# Patient Record
Sex: Male | Born: 1955
Health system: Southern US, Community
[De-identification: ages and names within clinical notes are randomized; demographics above are authoritative.]

## PROBLEM LIST (undated history)

## (undated) DIAGNOSIS — Z85828 Personal history of other malignant neoplasm of skin: Secondary | ICD-10-CM

## (undated) DIAGNOSIS — I10 Essential (primary) hypertension: Secondary | ICD-10-CM

## (undated) DIAGNOSIS — G4730 Sleep apnea, unspecified: Secondary | ICD-10-CM

## (undated) DIAGNOSIS — K219 Gastro-esophageal reflux disease without esophagitis: Secondary | ICD-10-CM

## (undated) DIAGNOSIS — J449 Chronic obstructive pulmonary disease, unspecified: Secondary | ICD-10-CM

## (undated) HISTORY — DX: Gastro-esophageal reflux disease without esophagitis: K21.9

## (undated) HISTORY — DX: Essential (primary) hypertension: I10

## (undated) HISTORY — DX: Sleep apnea, unspecified: G47.30

## (undated) HISTORY — PX: EYE SURGERY: SHX253

## (undated) HISTORY — PX: HERNIA REPAIR: SHX51

## (undated) HISTORY — DX: Personal history of other malignant neoplasm of skin: Z85.828

## (undated) HISTORY — PX: COSMETIC SURGERY: SHX468

## (undated) HISTORY — DX: Chronic obstructive pulmonary disease, unspecified: J44.9

---

## 2007-02-26 ENCOUNTER — Ambulatory Visit: Payer: Self-pay | Admitting: Surgery

## 2007-03-07 ENCOUNTER — Ambulatory Visit: Payer: Self-pay | Admitting: Surgery

## 2009-10-17 ENCOUNTER — Ambulatory Visit: Payer: Self-pay | Admitting: Gastroenterology

## 2009-11-01 ENCOUNTER — Ambulatory Visit: Payer: Self-pay | Admitting: Family Medicine

## 2013-11-24 ENCOUNTER — Ambulatory Visit: Payer: Self-pay | Admitting: Family Medicine

## 2014-11-29 ENCOUNTER — Telehealth: Payer: Self-pay | Admitting: Family Medicine

## 2014-11-29 DIAGNOSIS — N528 Other male erectile dysfunction: Secondary | ICD-10-CM

## 2014-11-29 DIAGNOSIS — K219 Gastro-esophageal reflux disease without esophagitis: Secondary | ICD-10-CM

## 2014-11-29 MED ORDER — OMEPRAZOLE 20 MG PO CPDR: 20.0000 mg | DELAYED_RELEASE_CAPSULE | Freq: Every day | ORAL | Status: DC

## 2014-11-29 MED ORDER — SILDENAFIL CITRATE 100 MG PO TABS: 100.0000 mg | ORAL_TABLET | Freq: Every day | ORAL | Status: DC | PRN

## 2014-11-29 NOTE — Telephone Encounter (Signed)
Pt is requesting a prescription for Omeprazole and Viagra. [facility] [phone](W) [phone] (C)

## 2014-12-07 ENCOUNTER — Other Ambulatory Visit: Payer: Self-pay | Admitting: Family Medicine

## 2015-01-11 ENCOUNTER — Ambulatory Visit (INDEPENDENT_AMBULATORY_CARE_PROVIDER_SITE_OTHER): Payer: 59 | Admitting: Family Medicine

## 2015-01-11 ENCOUNTER — Encounter: Payer: Self-pay | Admitting: Family Medicine

## 2015-01-11 ENCOUNTER — Ambulatory Visit
Admission: RE | Admit: 2015-01-11 | Discharge: 2015-01-11 | Disposition: A | Discharge status: Home / Self Care | Payer: 59 | Source: Ambulatory Visit | Attending: Family Medicine | Admitting: Family Medicine

## 2015-01-11 VITALS — BP 120/70 | HR 67 | Temp 98.1°F | Resp 18 | Ht 73.0 in | Wt 157.0 lb

## 2015-01-11 DIAGNOSIS — I10 Essential (primary) hypertension: Secondary | ICD-10-CM

## 2015-01-11 DIAGNOSIS — S92044D Nondisplaced other fracture of tuberosity of right calcaneus, subsequent encounter for fracture with routine healing: Secondary | ICD-10-CM | POA: Insufficient documentation

## 2015-01-11 DIAGNOSIS — J431 Panlobular emphysema: Secondary | ICD-10-CM

## 2015-01-11 DIAGNOSIS — X58XXXD Exposure to other specified factors, subsequent encounter: Secondary | ICD-10-CM | POA: Insufficient documentation

## 2015-01-11 DIAGNOSIS — M79671 Pain in right foot: Secondary | ICD-10-CM | POA: Diagnosis present

## 2015-01-11 DIAGNOSIS — R739 Hyperglycemia, unspecified: Secondary | ICD-10-CM

## 2015-01-11 DIAGNOSIS — N529 Male erectile dysfunction, unspecified: Secondary | ICD-10-CM | POA: Diagnosis not present

## 2015-01-11 DIAGNOSIS — J449 Chronic obstructive pulmonary disease, unspecified: Secondary | ICD-10-CM | POA: Insufficient documentation

## 2015-01-11 LAB — POCT GLYCOSYLATED HEMOGLOBIN (HGB A1C): HEMOGLOBIN A1C: 5.2

## 2015-01-11 LAB — GLUCOSE, POCT (MANUAL RESULT ENTRY): POC GLUCOSE: 107 mg/dL — AB (ref 70–99)

## 2015-01-11 NOTE — Progress Notes (Signed)
Name: Aaron Ross   MRN: [id number]    DOB: 1955/12/19   Date:01/11/2015       Progress Note  Subjective  Chief Complaint  Chief Complaint  Patient presents with  . Hypertension  . Foot Pain     hurt right foot 3 weks ago, swollen, painful    Hypertension This is a chronic problem. The current episode started more than 1 year ago. The problem is unchanged. The problem is controlled. Associated symptoms include anxiety and shortness of breath. Pertinent negatives include no blurred vision, chest pain, headaches, neck pain, orthopnea or palpitations. There are no associated agents to hypertension. Past treatments include angiotensin blockers. The current treatment provides moderate improvement.  Foot Pain This is a new (onset past jumping out of a pool) problem. The current episode started 1 to 4 weeks ago. The problem occurs constantly. The problem has been unchanged. Associated symptoms include coughing. Pertinent negatives include no chest pain, chills, congestion, fever, headaches, myalgias, nausea, neck pain, sore throat, vomiting or weakness. The symptoms are aggravated by walking. The treatment provided mild relief.  Breathing Problem He complains of cough, difficulty breathing and shortness of breath. There is no hemoptysis. This is a chronic problem. The current episode started more than 1 year ago. The problem occurs intermittently. The problem has been unchanged. The cough is non-productive. Pertinent negatives include no chest pain, fever, headaches, heartburn, myalgias, sore throat or weight loss. His symptoms are aggravated by exposure to smoke and strenuous activity. His symptoms are alleviated by ipratropium. He reports moderate improvement on treatment. His past medical history is significant for emphysema.   Hyperglycemia  H/o elevated glucose to 103.  No polyuria, polydipsia, polyhpahgia.  No significant wt loss.    Past Medical History  Diagnosis Date  . Hypertension   .  GERD (gastroesophageal reflux disease)     History  Substance Use Topics  . Smoking status: Former Smoker    Quit date: 07/13/2014  . Smokeless tobacco: Never Used  . Alcohol Use: 0.6 oz/week    1 Standard drinks or equivalent per week     Comment: glass of wine a day     Current outpatient prescriptions:  .  losartan (COZAAR) 50 MG tablet, Take 50 mg by mouth daily., Disp: , Rfl: 5 .  omeprazole (PRILOSEC) 20 MG capsule, Take 1 capsule (20 mg total) by mouth daily., Disp: 30 capsule, Rfl: 3 .  sildenafil (VIAGRA) 100 MG tablet, Take 1 tablet (100 mg total) by mouth daily as needed for erectile dysfunction., Disp: 5 tablet, Rfl: 11  No Known Allergies  Review of Systems  Constitutional: Negative for fever, chills and weight loss.  HENT: Negative for congestion, hearing loss, sore throat and tinnitus.   Eyes: Negative for blurred vision, double vision and redness.  Respiratory: Positive for cough and shortness of breath. Negative for hemoptysis.   Cardiovascular: Negative for chest pain, palpitations, orthopnea, claudication and leg swelling.  Gastrointestinal: Negative for heartburn, nausea, vomiting, diarrhea, constipation and blood in stool.  Genitourinary: Negative for dysuria, urgency, frequency and hematuria.  Musculoskeletal: Negative for myalgias, back pain, joint pain, falls and neck pain.       Right foot pain   Skin: Negative for itching.  Neurological: Negative for dizziness, tingling, tremors, focal weakness, seizures, loss of consciousness, weakness and headaches.  Endo/Heme/Allergies: Does not bruise/bleed easily.  Psychiatric/Behavioral: Negative for depression and substance abuse. The patient is not nervous/anxious and does not have insomnia.  Objective  Filed Vitals:   01/11/15 0737  BP: 120/70  Pulse: 67  Temp: 98.1 F (36.7 C)  TempSrc: Oral  Resp: 18  Height: 6\' 1"  (1.854 m)  Weight: 157 lb (71.215 kg)     Physical Exam  Constitutional: He  is oriented to person, place, and time and well-developed, well-nourished, and in no distress.  HENT:  Head: Normocephalic.  Eyes: EOM are normal. Pupils are equal, round, and reactive to light.  Neck: Normal range of motion. Neck supple. No thyromegaly present.  Cardiovascular: Normal rate, regular rhythm and normal heart sounds.   No murmur heard. Pulmonary/Chest: Effort normal and breath sounds normal. No respiratory distress. He has no wheezes.  Abdominal: Soft. Bowel sounds are normal.  Musculoskeletal: He exhibits no edema.  Tender on calcaneous both inferiorly and posteriorly   Lymphadenopathy:    He has no cervical adenopathy.  Neurological: [name] and oriented to person, place, and time. No cranial nerve deficit. Gait normal. Coordination normal.  Skin: Skin is warm and dry. No rash noted.  Psychiatric: Affect and judgment normal.      Assessment & Plan  ,1. Essential hypertension controlled - Comprehensive metabolic panel  2. Panlobular emphysema Refused to use Symbicort  3. Hyperglycemia R/o diabetes - POCT HgB A1C - POCT Glucose (CBG) - Comprehensive metabolic panel  4. Erectile dysfunction, unspecified erectile dysfunction type viagra  5. Foot pain, right Xray to r/o fx - DG Foot Complete Right; Future

## 2015-01-11 NOTE — Patient Instructions (Signed)
Xray right foot.

## 2015-01-13 ENCOUNTER — Telehealth: Payer: Self-pay | Admitting: Emergency Medicine

## 2015-01-13 DIAGNOSIS — S92001A Unspecified fracture of right calcaneus, initial encounter for closed fracture: Secondary | ICD-10-CM

## 2015-01-13 NOTE — Telephone Encounter (Signed)
Patient notified of lab results and appointment has been made for [facility]

## 2015-03-18 ENCOUNTER — Other Ambulatory Visit: Payer: Self-pay | Admitting: Family Medicine

## 2015-03-21 NOTE — Telephone Encounter (Signed)
I am forwarding this encounter to the designated PCP and/or their nursing staff for further management of the tasks requested. Thank you.  

## 2015-08-15 ENCOUNTER — Other Ambulatory Visit: Payer: Self-pay | Admitting: Family Medicine

## 2015-10-19 ENCOUNTER — Other Ambulatory Visit: Payer: Self-pay | Admitting: Family Medicine

## 2015-10-25 ENCOUNTER — Encounter: Payer: Self-pay | Admitting: Family Medicine

## 2015-10-25 ENCOUNTER — Ambulatory Visit (INDEPENDENT_AMBULATORY_CARE_PROVIDER_SITE_OTHER): Payer: 59 | Admitting: Family Medicine

## 2015-10-25 VITALS — BP 160/88 | HR 58 | Temp 97.9°F | Resp 18 | Ht 73.0 in | Wt 160.8 lb

## 2015-10-25 DIAGNOSIS — I10 Essential (primary) hypertension: Secondary | ICD-10-CM | POA: Diagnosis not present

## 2015-10-25 MED ORDER — LOSARTAN POTASSIUM 100 MG PO TABS: 100.0000 mg | ORAL_TABLET | Freq: Every day | ORAL | Status: DC

## 2015-10-25 NOTE — Progress Notes (Signed)
Name: Aaron Ross   MRN: [id number]    DOB: June 26, 1955   Date:10/25/2015       Progress Note  Subjective  Chief Complaint  Chief Complaint  Patient presents with  . Hypertension    BP has been elevated in the 160's    Hypertension This is a chronic problem. The problem is unchanged. The problem is uncontrolled. Pertinent negatives include no blurred vision, chest pain, headaches, palpitations or shortness of breath. Past treatments include angiotensin blockers. There are no compliance problems.  There is no history of kidney disease, CAD/MI or CVA.    Past Medical History  Diagnosis Date  . Hypertension   . GERD (gastroesophageal reflux disease)     Past Surgical History  Procedure Laterality Date  . Hernia repair      Family History  Problem Relation Age of Onset  . Hypertension Father     Social History   Social History  . Marital Status: Married    Spouse Name: N/A  . Number of Children: N/A  . Years of Education: N/A   Occupational History  . Not on file.   Social History Main Topics  . Smoking status: Former Smoker    Quit date: 07/13/2014  . Smokeless tobacco: Never Used  . Alcohol Use: 0.6 oz/week    1 Standard drinks or equivalent per week     Comment: glass of wine a day  . Drug Use: No  . Sexual Activity: Yes   Other Topics Concern  . Not on file   Social History Narrative     Current outpatient prescriptions:  .  aspirin EC 81 MG tablet, Take 81 mg by mouth daily., Disp: , Rfl:  .  losartan (COZAAR) 50 MG tablet, TAKE 1 TABLET BY MOUTH DAILY, Disp: 30 tablet, Rfl: 1 .  omeprazole (PRILOSEC) 20 MG capsule, TAKE ONE CAPSULE BY MOUTH DAILY, Disp: 30 capsule, Rfl: 3 .  sildenafil (VIAGRA) 100 MG tablet, Take 1 tablet (100 mg total) by mouth daily as needed for erectile dysfunction., Disp: 5 tablet, Rfl: 11  No Known Allergies   Review of Systems  Eyes: Negative for blurred vision.  Respiratory: Negative for shortness of breath.    Cardiovascular: Negative for chest pain and palpitations.  Neurological: Negative for headaches.   Objective  Filed Vitals:   10/25/15 1057  BP: 160/88  Pulse: 58  Temp: 97.9 F (36.6 C)  TempSrc: Oral  Resp: 18  Height:  (1.854 m)  Weight: 160 lb 12.8 oz (72.938 kg)  SpO2: 96%    Physical Exam  Constitutional: He is oriented to person, place, and time and well-developed, well-nourished, and in no distress.  HENT:  Head: Normocephalic and atraumatic.  Cardiovascular: Normal rate and regular rhythm.   Pulmonary/Chest: Effort normal and breath sounds normal.  Musculoskeletal:       Right ankle: He exhibits no swelling.       Left ankle: He exhibits no swelling.  Neurological: [name] and oriented to person, place, and time.  Nursing note and vitals reviewed.     Assessment & Plan  1. Essential hypertension Elevated blood pressure on manual repeat, will increase losartan from 50 mg to 100 mg daily. Advised to keep blood pressure logs and follow-up in one month. - Comprehensive Metabolic Panel (CMET) - Lipid Profile - losartan (COZAAR) 100 MG tablet; Take 1 tablet (100 mg total) by mouth daily.  Dispense: 30 tablet; Refill: 0    Maryana Pittmon Asad A. Sherryll Burger  [facility] 10/25/2015 11:06 AM

## 2015-10-26 LAB — COMPREHENSIVE METABOLIC PANEL
A/G RATIO: 1.9 (ref 1.2–2.2)
ALT: 25 IU/L (ref 0–44)
AST: 27 IU/L (ref 0–40)
Albumin: 4.7 g/dL (ref 3.5–5.5)
Alkaline Phosphatase: 87 IU/L (ref 39–117)
BUN/Creatinine Ratio: 15 (ref 9–20)
BUN: 14 mg/dL (ref 6–24)
Bilirubin Total: 0.5 mg/dL (ref 0.0–1.2)
CALCIUM: 9.7 mg/dL (ref 8.7–10.2)
CO2: 26 mmol/L (ref 18–29)
CREATININE: 0.94 mg/dL (ref 0.76–1.27)
Chloride: 99 mmol/L (ref 96–106)
GFR calc Af Amer: 103 mL/min/{1.73_m2} (ref >59)
GFR, EST NON AFRICAN AMERICAN: 89 mL/min/{1.73_m2} (ref >59)
Globulin, Total: 2.5 g/dL (ref 1.5–4.5)
Glucose: 100 mg/dL — ABNORMAL HIGH (ref 65–99)
POTASSIUM: 5.6 mmol/L — AB (ref 3.5–5.2)
Sodium: 142 mmol/L (ref 134–144)
Total Protein: 7.2 g/dL (ref 6.0–8.5)

## 2015-10-26 LAB — LIPID PANEL
CHOL/HDL RATIO: 2.8 ratio (ref 0.0–5.0)
Cholesterol, Total: 205 mg/dL — ABNORMAL HIGH (ref 100–199)
HDL: 74 mg/dL (ref >39)
LDL Calculated: 113 mg/dL — ABNORMAL HIGH (ref 0–99)
TRIGLYCERIDES: 92 mg/dL (ref 0–149)
VLDL Cholesterol Cal: 18 mg/dL (ref 5–40)

## 2015-11-29 ENCOUNTER — Ambulatory Visit (INDEPENDENT_AMBULATORY_CARE_PROVIDER_SITE_OTHER): Payer: BLUE CROSS/BLUE SHIELD | Admitting: Family Medicine

## 2015-11-29 ENCOUNTER — Encounter: Payer: Self-pay | Admitting: Family Medicine

## 2015-11-29 VITALS — BP 141/78 | HR 60 | Temp 98.1°F | Resp 15 | Ht 73.0 in | Wt 164.2 lb

## 2015-11-29 DIAGNOSIS — E785 Hyperlipidemia, unspecified: Secondary | ICD-10-CM | POA: Diagnosis not present

## 2015-11-29 DIAGNOSIS — K219 Gastro-esophageal reflux disease without esophagitis: Secondary | ICD-10-CM | POA: Diagnosis not present

## 2015-11-29 DIAGNOSIS — I10 Essential (primary) hypertension: Secondary | ICD-10-CM

## 2015-11-29 MED ORDER — LOSARTAN POTASSIUM 100 MG PO TABS: 100.0000 mg | ORAL_TABLET | Freq: Every day | ORAL | Status: DC

## 2015-11-29 MED ORDER — OMEPRAZOLE 20 MG PO CPDR
20.0000 mg | DELAYED_RELEASE_CAPSULE | Freq: Every day | ORAL | Status: DC
Start: 2015-11-29 — End: 2016-05-24

## 2015-11-29 MED ORDER — ROSUVASTATIN CALCIUM 5 MG PO TABS: 5.0000 mg | ORAL_TABLET | Freq: Every day | ORAL | Status: DC

## 2015-11-29 NOTE — Progress Notes (Signed)
Name: Aaron ForthZoltan Gellerman   MRN: [id number]    DOB: 10/09/1955   Date:11/29/2015       Progress Note  Subjective  Chief Complaint  Chief Complaint  Patient presents with  . Follow-up    1 mo  . Medication Refill    losartan 100 mg / omeprazole 20 mg     Hypertension This is a chronic problem. The problem is unchanged. The problem is uncontrolled. Pertinent negatives include no blurred vision, chest pain, headaches, palpitations or shortness of breath. Past treatments include angiotensin blockers. There are no compliance problems.  There is no history of kidney disease, CAD/MI or CVA.  Gastroesophageal Reflux He reports no abdominal pain, no chest pain, no coughing, no heartburn or no nausea. This is a chronic problem. The problem has been unchanged. The symptoms are aggravated by certain foods (tomatoes, pepperoni, V8 juice.). He has tried a PPI for the symptoms.  Hyperlipidemia This is a new problem. This is a new diagnosis. The problem is uncontrolled. Pertinent negatives include no chest pain or shortness of breath. He is currently on no antihyperlipidemic treatment.    Past Medical History  Diagnosis Date  . Hypertension   . GERD (gastroesophageal reflux disease)     Past Surgical History  Procedure Laterality Date  . Hernia repair      Family History  Problem Relation Age of Onset  . Hypertension Father     Social History   Social History  . Marital Status: Married    Spouse Name: N/A  . Number of Children: N/A  . Years of Education: N/A   Occupational History  . Not on file.   Social History Main Topics  . Smoking status: Former Smoker    Quit date: 07/13/2014  . Smokeless tobacco: Never Used  . Alcohol Use: 0.6 oz/week    1 Standard drinks or equivalent per week     Comment: glass of wine a day  . Drug Use: No  . Sexual Activity: Yes   Other Topics Concern  . Not on file   Social History Narrative     Current outpatient prescriptions:  .  aspirin EC 81  MG tablet, Take 81 mg by mouth daily., Disp: , Rfl:  .  finasteride (PROSCAR) 5 MG tablet, Take 5 mg by mouth daily., Disp: , Rfl: 5 .  losartan (COZAAR) 100 MG tablet, Take 1 tablet (100 mg total) by mouth daily., Disp: 30 tablet, Rfl: 0 .  omeprazole (PRILOSEC) 20 MG capsule, TAKE ONE CAPSULE BY MOUTH DAILY, Disp: 30 capsule, Rfl: 3 .  sildenafil (VIAGRA) 100 MG tablet, Take 1 tablet (100 mg total) by mouth daily as needed for erectile dysfunction., Disp: 5 tablet, Rfl: 11  No Known Allergies   Review of Systems  Eyes: Negative for blurred vision.  Respiratory: Negative for cough and shortness of breath.   Cardiovascular: Negative for chest pain and palpitations.  Gastrointestinal: Negative for heartburn, nausea, vomiting and abdominal pain.  Neurological: Negative for headaches.    Objective  Filed Vitals:   11/29/15 0843  BP: 141/78  Pulse: 60  Temp: 98.1 F (36.7 C)  TempSrc: Oral  Resp: 15  Height: 6\' 1"  (1.854 m)  Weight: 164 lb 3.2 oz (74.481 kg)  SpO2: 97%    Physical Exam  Constitutional: He is oriented to person, place, and time and well-developed, well-nourished, and in no distress.  HENT:  Head: Normocephalic and atraumatic.  Eyes: Pupils are equal, round, and reactive to light.  Cardiovascular: Normal rate, regular rhythm, S1 normal, S2 normal and normal heart sounds.   No murmur heard. Pulmonary/Chest: Effort normal and breath sounds normal.  Abdominal: Soft. Bowel sounds are normal.  Musculoskeletal:       Right ankle: He exhibits no swelling.       Left ankle: He exhibits no swelling.  Neurological: [name] and oriented to person, place, and time.  Nursing note and vitals reviewed.     Assessment & Plan  1. Essential hypertension Advised to check blood pressure periodically, refill for losartan provided. - losartan (COZAAR) 100 MG tablet; Take 1 tablet (100 mg total) by mouth daily.  Dispense: 90 tablet; Refill: 1  2.  Hyperlipidemia Started on rosuvastatin 5 mg at bedtime for hyperlipidemia. Recheck FLP in 3 months - rosuvastatin (CRESTOR) 5 MG tablet; Take 1 tablet (5 mg total) by mouth daily.  Dispense: 90 tablet; Refill: 1  3. Gastroesophageal reflux disease, esophagitis presence not specified Stable on PPI therapy. Refills provided - omeprazole (PRILOSEC) 20 MG capsule; Take 1 capsule (20 mg total) by mouth daily.  Dispense: 90 capsule; Refill: 1  Othal Kubitz Asad A. [facility] 11/29/2015 9:02 AM

## 2015-12-12 ENCOUNTER — Other Ambulatory Visit: Payer: Self-pay | Admitting: Family Medicine

## 2016-02-29 ENCOUNTER — Ambulatory Visit: Payer: BLUE CROSS/BLUE SHIELD | Admitting: Family Medicine

## 2016-03-07 DIAGNOSIS — Z23 Encounter for immunization: Secondary | ICD-10-CM | POA: Diagnosis not present

## 2016-03-13 DIAGNOSIS — Z23 Encounter for immunization: Secondary | ICD-10-CM | POA: Diagnosis not present

## 2016-05-04 ENCOUNTER — Telehealth: Payer: Self-pay | Admitting: Family Medicine

## 2016-05-04 NOTE — Telephone Encounter (Signed)
Patient has not been evaluated for tobacco abuse before, he will need to schedule an appointment to discuss appropriate medication

## 2016-05-04 NOTE — Telephone Encounter (Signed)
Patient stated that he started smoking and again and would like to stop.  He would like a [facility] [address]

## 2016-05-12 DIAGNOSIS — J069 Acute upper respiratory infection, unspecified: Secondary | ICD-10-CM | POA: Diagnosis not present

## 2016-05-24 ENCOUNTER — Ambulatory Visit (INDEPENDENT_AMBULATORY_CARE_PROVIDER_SITE_OTHER): Payer: BLUE CROSS/BLUE SHIELD | Admitting: Family Medicine

## 2016-05-24 ENCOUNTER — Encounter: Payer: Self-pay | Admitting: Family Medicine

## 2016-05-24 VITALS — BP 120/70 | HR 89 | Temp 98.3°F | Resp 16 | Ht 73.0 in | Wt 158.8 lb

## 2016-05-24 DIAGNOSIS — I10 Essential (primary) hypertension: Secondary | ICD-10-CM

## 2016-05-24 DIAGNOSIS — K219 Gastro-esophageal reflux disease without esophagitis: Secondary | ICD-10-CM

## 2016-05-24 DIAGNOSIS — Z720 Tobacco use: Secondary | ICD-10-CM | POA: Diagnosis not present

## 2016-05-24 MED ORDER — LOSARTAN POTASSIUM 100 MG PO TABS: 100.0000 mg | ORAL_TABLET | Freq: Every day | ORAL | 1 refills | Status: DC

## 2016-05-24 MED ORDER — OMEPRAZOLE 20 MG PO CPDR: 20.0000 mg | DELAYED_RELEASE_CAPSULE | Freq: Every day | ORAL | 1 refills | Status: DC

## 2016-05-24 MED ORDER — VARENICLINE TARTRATE 1 MG PO TABS: 1.0000 mg | ORAL_TABLET | Freq: Two times a day (BID) | ORAL | 0 refills | Status: DC

## 2016-05-24 MED ORDER — VARENICLINE TARTRATE 0.5 MG X 11 & 1 MG X 42 PO MISC: ORAL | 0 refills | Status: DC

## 2016-05-24 NOTE — Progress Notes (Signed)
Name: Aaron Ross   MRN: [id number]    DOB: 08/30/1955   Date:05/24/2016       Progress Note  Subjective  Chief Complaint  Chief Complaint  Patient presents with  . Follow-up    would like to restart chantix, Had stopped for 1 year and started back    Nicotine Dependence  Presents for initial visit. Symptoms include cravings. Preferred tobacco types include cigars. Preferred cigarette types include filtered. Preferred brands include Marlboro. His urge triggers include company of smokers (girlfriend smokes and he picked up after her). He smokes 2 packs of cigarettes per day. Past treatments include varenicline (In the past, he has been prescribed Chantix and it helped him quit. ).    Past Medical History:  Diagnosis Date  . GERD (gastroesophageal reflux disease)   . Hypertension     Past Surgical History:  Procedure Laterality Date  . HERNIA REPAIR      Family History  Problem Relation Age of Onset  . Hypertension Father     Social History   Social History  . Marital status: Married    Spouse name: N/A  . Number of children: N/A  . Years of education: N/A   Occupational History  . Not on file.   Social History Main Topics  . Smoking status: Former Smoker    Quit date: 07/13/2014  . Smokeless tobacco: Never Used  . Alcohol use 0.6 oz/week    1 Standard drinks or equivalent per week     Comment: glass of wine a day  . Drug use: No  . Sexual activity: Yes   Other Topics Concern  . Not on file   Social History Narrative  . No narrative on file     Current Outpatient Prescriptions:  .  aspirin EC 81 MG tablet, Take 81 mg by mouth daily., Disp: , Rfl:  .  finasteride (PROSCAR) 5 MG tablet, Take 5 mg by mouth daily., Disp: , Rfl: 5 .  losartan (COZAAR) 100 MG tablet, Take 1 tablet (100 mg total) by mouth daily., Disp: 90 tablet, Rfl: 1 .  omeprazole (PRILOSEC) 20 MG capsule, Take 1 capsule (20 mg total) by mouth daily., Disp: 90 capsule, Rfl: 1 .  rosuvastatin  (CRESTOR) 5 MG tablet, Take 1 tablet (5 mg total) by mouth daily., Disp: 90 tablet, Rfl: 1 .  sildenafil (VIAGRA) 100 MG tablet, Take 1 tablet (100 mg total) by mouth daily as needed for erectile dysfunction., Disp: 5 tablet, Rfl: 11  No Known Allergies   Review of Systems  Constitutional: Negative for chills, fever and malaise/fatigue.  Respiratory: Negative for cough, sputum production and shortness of breath.   Cardiovascular: Negative for chest pain.  Gastrointestinal: Negative for abdominal pain, blood in stool, heartburn, nausea and vomiting.  Neurological: Negative for headaches.    Objective  Vitals:   05/24/16 1523  BP: 120/70  Pulse: 89  Resp: 16  Temp: 98.3 F (36.8 C)  TempSrc: Oral  SpO2: 96%  Weight: 158 lb 12.8 oz (72 kg)  Height: 6\' 1"  (1.854 m)    Physical Exam  Constitutional: He is oriented to person, place, and time and well-developed, well-nourished, and in no distress.  HENT:  Head: Normocephalic and atraumatic.  Cardiovascular: Normal rate, regular rhythm and normal heart sounds.   No murmur heard. Pulmonary/Chest: Effort normal and breath sounds normal. He has no wheezes.  Abdominal: Soft. Bowel sounds are normal. There is no tenderness.  Neurological: [name] and oriented to  person, place, and time.  Psychiatric: Mood, memory, affect and judgment normal.  Nursing note and vitals reviewed.      Assessment & Plan  1. Tobacco abuse Patient has quit smoking with Chantix before, we will restart on Chantix with starting and continuing month packs. Follow-up in 2 months - varenicline (CHANTIX STARTING MONTH PAK) 0.5 MG X 11 & 1 MG X 42 tablet; Take one 0.5 mg tablet by mouth once daily for 3 days, then increase to one 0.5 mg tablet twice daily for 4 days, then increase to one 1 mg tablet twice daily.  Dispense: 53 tablet; Refill: 0 - varenicline (CHANTIX CONTINUING MONTH PAK) 1 MG tablet; Take 1 tablet (1 mg total) by mouth 2 (two) times daily.   Dispense: 120 tablet; Refill: 0  2. Essential hypertension  - losartan (COZAAR) 100 MG tablet; Take 1 tablet (100 mg total) by mouth daily.  Dispense: 90 tablet; Refill: 1  3. Gastroesophageal reflux disease, esophagitis presence not specified  - omeprazole (PRILOSEC) 20 MG capsule; Take 1 capsule (20 mg total) by mouth daily.  Dispense: 90 capsule; Refill: 1   Aaron Ross [facility] 05/24/2016 4:04 PM

## 2016-06-13 ENCOUNTER — Other Ambulatory Visit: Payer: Self-pay | Admitting: Family Medicine

## 2016-06-13 DIAGNOSIS — K219 Gastro-esophageal reflux disease without esophagitis: Secondary | ICD-10-CM

## 2016-07-24 ENCOUNTER — Ambulatory Visit: Payer: BLUE CROSS/BLUE SHIELD | Admitting: Family Medicine

## 2016-10-11 ENCOUNTER — Emergency Department: Payer: BLUE CROSS/BLUE SHIELD

## 2016-10-11 ENCOUNTER — Inpatient Hospital Stay
Admission: EM | Admit: 2016-10-11 | Discharge: 2016-10-13 | DRG: 201 | Disposition: A | Discharge status: Home / Self Care | Payer: BLUE CROSS/BLUE SHIELD | Attending: Surgery | Admitting: Surgery

## 2016-10-11 ENCOUNTER — Encounter: Payer: Self-pay | Admitting: Emergency Medicine

## 2016-10-11 DIAGNOSIS — I700 Atherosclerosis of aorta: Secondary | ICD-10-CM | POA: Diagnosis not present

## 2016-10-11 DIAGNOSIS — E785 Hyperlipidemia, unspecified: Secondary | ICD-10-CM | POA: Diagnosis not present

## 2016-10-11 DIAGNOSIS — J439 Emphysema, unspecified: Secondary | ICD-10-CM | POA: Diagnosis not present

## 2016-10-11 DIAGNOSIS — K219 Gastro-esophageal reflux disease without esophagitis: Secondary | ICD-10-CM | POA: Diagnosis not present

## 2016-10-11 DIAGNOSIS — J9383 Other pneumothorax: Principal | ICD-10-CM | POA: Diagnosis present

## 2016-10-11 DIAGNOSIS — R0602 Shortness of breath: Secondary | ICD-10-CM | POA: Diagnosis not present

## 2016-10-11 DIAGNOSIS — N529 Male erectile dysfunction, unspecified: Secondary | ICD-10-CM | POA: Diagnosis present

## 2016-10-11 DIAGNOSIS — Z8249 Family history of ischemic heart disease and other diseases of the circulatory system: Secondary | ICD-10-CM | POA: Diagnosis not present

## 2016-10-11 DIAGNOSIS — J939 Pneumothorax, unspecified: Secondary | ICD-10-CM

## 2016-10-11 DIAGNOSIS — Z9889 Other specified postprocedural states: Secondary | ICD-10-CM | POA: Diagnosis not present

## 2016-10-11 DIAGNOSIS — J449 Chronic obstructive pulmonary disease, unspecified: Secondary | ICD-10-CM | POA: Diagnosis not present

## 2016-10-11 DIAGNOSIS — R079 Chest pain, unspecified: Secondary | ICD-10-CM | POA: Diagnosis not present

## 2016-10-11 DIAGNOSIS — F17210 Nicotine dependence, cigarettes, uncomplicated: Secondary | ICD-10-CM | POA: Diagnosis present

## 2016-10-11 DIAGNOSIS — Z79899 Other long term (current) drug therapy: Secondary | ICD-10-CM

## 2016-10-11 DIAGNOSIS — Z7982 Long term (current) use of aspirin: Secondary | ICD-10-CM

## 2016-10-11 DIAGNOSIS — I10 Essential (primary) hypertension: Secondary | ICD-10-CM | POA: Diagnosis present

## 2016-10-11 DIAGNOSIS — Z8709 Personal history of other diseases of the respiratory system: Secondary | ICD-10-CM | POA: Diagnosis present

## 2016-10-11 LAB — BASIC METABOLIC PANEL
ANION GAP: 7 (ref 5–15)
BUN: 16 mg/dL (ref 6–20)
CALCIUM: 9.1 mg/dL (ref 8.9–10.3)
CO2: 26 mmol/L (ref 22–32)
Chloride: 104 mmol/L (ref 101–111)
Creatinine, Ser: 0.92 mg/dL (ref 0.61–1.24)
GFR calc Af Amer: >60 mL/min (ref >60)
GLUCOSE: 117 mg/dL — AB (ref 65–99)
POTASSIUM: 4.1 mmol/L (ref 3.5–5.1)
SODIUM: 137 mmol/L (ref 135–145)

## 2016-10-11 LAB — TROPONIN I

## 2016-10-11 LAB — CBC
HCT: 48.3 % (ref 40.0–52.0)
HEMOGLOBIN: 16 g/dL (ref 13.0–18.0)
MCH: 30.4 pg (ref 26.0–34.0)
MCHC: 33.1 g/dL (ref 32.0–36.0)
MCV: 91.7 fL (ref 80.0–100.0)
Platelets: 352 10*3/uL (ref 150–440)
RBC: 5.27 MIL/uL (ref 4.40–5.90)
RDW: 14.5 % (ref 11.5–14.5)
WBC: 9.3 10*3/uL (ref 3.8–10.6)

## 2016-10-11 LAB — PROTIME-INR
INR: 1.01
Prothrombin Time: 13.3 seconds (ref 11.4–15.2)

## 2016-10-11 MED ORDER — LIDOCAINE-EPINEPHRINE (PF) 1 %-1:200000 IJ SOLN
30.0000 mL | Freq: Once | INTRAMUSCULAR | Status: AC
  Administered 2016-10-11: 30 mL
  Filled 2016-10-11: qty 30

## 2016-10-11 MED ORDER — MORPHINE SULFATE (PF) 4 MG/ML IV SOLN
4.0000 mg | Freq: Once | INTRAVENOUS | Status: AC
  Administered 2016-10-11: 4 mg via INTRAVENOUS
  Filled 2016-10-11: qty 1

## 2016-10-11 MED ORDER — ONDANSETRON HCL 4 MG/2ML IJ SOLN
INTRAMUSCULAR | Status: AC
  Administered 2016-10-11: 4 mg via INTRAVENOUS
  Filled 2016-10-11: qty 2

## 2016-10-11 MED ORDER — POLYETHYLENE GLYCOL 3350 17 G PO PACK: 17.0000 g | PACK | Freq: Every day | ORAL | Status: DC | PRN

## 2016-10-11 MED ORDER — OXYCODONE HCL 5 MG PO TABS: 5.0000 mg | ORAL_TABLET | ORAL | Status: DC | PRN

## 2016-10-11 MED ORDER — ONDANSETRON HCL 4 MG/2ML IJ SOLN
4.0000 mg | Freq: Once | INTRAMUSCULAR | Status: AC
  Administered 2016-10-11: 4 mg via INTRAVENOUS

## 2016-10-11 MED ORDER — HYDROMORPHONE HCL 1 MG/ML IJ SOLN
0.5000 mg | Freq: Once | INTRAMUSCULAR | Status: AC
  Administered 2016-10-11: 0.5 mg via INTRAVENOUS

## 2016-10-11 MED ORDER — PANTOPRAZOLE SODIUM 40 MG PO TBEC
40.0000 mg | DELAYED_RELEASE_TABLET | Freq: Every day | ORAL | Status: DC
  Administered 2016-10-12 – 2016-10-13 (×2): 40 mg via ORAL
  Filled 2016-10-11 (×2): qty 1

## 2016-10-11 MED ORDER — LOSARTAN POTASSIUM 50 MG PO TABS
100.0000 mg | ORAL_TABLET | Freq: Every day | ORAL | Status: DC
  Administered 2016-10-12 – 2016-10-13 (×2): 100 mg via ORAL
  Filled 2016-10-11 (×2): qty 2

## 2016-10-11 MED ORDER — ONDANSETRON 4 MG PO TBDP: 4.0000 mg | ORAL_TABLET | Freq: Four times a day (QID) | ORAL | Status: DC | PRN

## 2016-10-11 MED ORDER — MORPHINE SULFATE (PF) 4 MG/ML IV SOLN
4.0000 mg | Freq: Once | INTRAVENOUS | Status: AC
Start: 2016-10-11 — End: 2016-10-11
  Administered 2016-10-11: 4 mg via INTRAVENOUS
  Filled 2016-10-11: qty 1

## 2016-10-11 MED ORDER — ACETAMINOPHEN 500 MG PO TABS
1000.0000 mg | ORAL_TABLET | Freq: Four times a day (QID) | ORAL | Status: DC
  Administered 2016-10-11 – 2016-10-13 (×5): 1000 mg via ORAL
  Filled 2016-10-11 (×7): qty 2

## 2016-10-11 MED ORDER — ROSUVASTATIN CALCIUM 5 MG PO TABS
5.0000 mg | ORAL_TABLET | Freq: Every day | ORAL | Status: DC
  Administered 2016-10-12 – 2016-10-13 (×2): 5 mg via ORAL
  Filled 2016-10-11 (×3): qty 1

## 2016-10-11 MED ORDER — ENOXAPARIN SODIUM 40 MG/0.4ML ~~LOC~~ SOLN
40.0000 mg | SUBCUTANEOUS | Status: DC
  Administered 2016-10-11 – 2016-10-12 (×2): 40 mg via SUBCUTANEOUS
  Filled 2016-10-11 (×2): qty 0.4

## 2016-10-11 MED ORDER — FINASTERIDE 5 MG PO TABS
5.0000 mg | ORAL_TABLET | Freq: Every day | ORAL | Status: DC
  Administered 2016-10-12 – 2016-10-13 (×2): 5 mg via ORAL
  Filled 2016-10-11 (×2): qty 1

## 2016-10-11 MED ORDER — HYDROMORPHONE HCL 1 MG/ML IJ SOLN
0.5000 mg | INTRAMUSCULAR | Status: DC | PRN
  Filled 2016-10-11: qty 1

## 2016-10-11 MED ORDER — ONDANSETRON HCL 4 MG/2ML IJ SOLN: 4.0000 mg | Freq: Four times a day (QID) | INTRAMUSCULAR | Status: DC | PRN

## 2016-10-11 MED ORDER — KETOROLAC TROMETHAMINE 30 MG/ML IJ SOLN
30.0000 mg | Freq: Four times a day (QID) | INTRAMUSCULAR | Status: DC
  Administered 2016-10-11 – 2016-10-13 (×5): 30 mg via INTRAVENOUS
  Filled 2016-10-11 (×7): qty 1

## 2016-10-11 MED ORDER — HYDROMORPHONE HCL 1 MG/ML IJ SOLN
0.5000 mg | Freq: Once | INTRAMUSCULAR | Status: AC
  Administered 2016-10-11: 0.5 mg via INTRAVENOUS
  Filled 2016-10-11: qty 1

## 2016-10-11 MED ORDER — ETOMIDATE 2 MG/ML IV SOLN
10.0000 mg | Freq: Once | INTRAVENOUS | Status: AC
  Administered 2016-10-11: 10 mg via INTRAVENOUS
  Filled 2016-10-11: qty 10

## 2016-10-11 NOTE — ED Notes (Signed)
Report given to Anabella, RN. 

## 2016-10-11 NOTE — ED Notes (Signed)
Pt placed on 2L nasal cannula per verbal MD order.

## 2016-10-11 NOTE — ED Triage Notes (Signed)
Pt via pov from home with chest pain that started this morning. Describes as pressure with occasional shooting pain; sob accompanies. Pt smokes 1-1.5 ppd. Pt alert & oriented with NAD noted.

## 2016-10-11 NOTE — ED Provider Notes (Signed)
[facility] Emergency Department Provider Note       Time seen: ----------------------------------------- 10:14 AM on 10/11/2016 -----------------------------------------     I have reviewed the triage vital signs and the nursing notes.   HISTORY   Chief Complaint Chest Pain    HPI Aaron Ross is a 61 y.o. male who presents to the ED for chest pain that started this morning. He describes as pressure with occasional shooting pain. This is accompanied by shortness of breath, states he smokes a pack to a pack and half a day. Nothing makes his symptoms better or worse. He denies fevers, chills or other complaints.   Past Medical History:  Diagnosis Date  . GERD (gastroesophageal reflux disease)   . Hypertension     Patient Active Problem List   Diagnosis Date Noted  . Tobacco abuse 05/24/2016  . Hyperlipidemia 11/29/2015  . GERD (gastroesophageal reflux disease) 11/29/2015  . HTN (hypertension) 01/11/2015  . COPD (chronic obstructive pulmonary disease) (HCC) 01/11/2015  . Erectile dysfunction 01/11/2015  . Hyperglycemia 01/11/2015  . Foot pain, right 01/11/2015    Past Surgical History:  Procedure Laterality Date  . COSMETIC SURGERY    . HERNIA REPAIR      Allergies Patient has no known allergies.  Social History Social History  Substance Use Topics  . Smoking status: Current Every Day Smoker    Packs/day: 1.00    Last attempt to quit: 07/13/2014  . Smokeless tobacco: Never Used  . Alcohol use 0.6 oz/week    1 Standard drinks or equivalent per week     Comment: glass of wine a day    Review of Systems Constitutional: Negative for fever. Cardiovascular: Positive for chest pain Respiratory: Positive for difficulty breathing Gastrointestinal: Negative for abdominal pain, vomiting and diarrhea. Genitourinary: Negative for dysuria. Musculoskeletal: Negative for back pain. Skin: Negative for rash. Neurological: Negative for  headaches, focal weakness or numbness.  10-point ROS otherwise negative.  ____________________________________________   PHYSICAL EXAM:  VITAL SIGNS: ED Triage Vitals  Enc Vitals Group     BP 10/11/16 0941 (!) 139/93     Pulse Rate 10/11/16 0941 75     Resp 10/11/16 0941 20     Temp 10/11/16 0941 98.5 F (36.9 C)     Temp Source 10/11/16 0941 Oral     SpO2 10/11/16 0941 92 %     Weight 10/11/16 0942 160 lb (72.6 kg)     Height 10/11/16 0942  (1.854 m)     Head Circumference --      Peak Flow --      Pain Score 10/11/16 0941 5     Pain Loc --      Pain Edu? --      Excl. in GC? --     Constitutional: Alert and oriented. Mild distress Eyes: Conjunctivae are normal. PERRL. Normal extraocular movements. ENT   Head: Normocephalic and atraumatic.   Nose: No congestion/rhinnorhea.   Mouth/Throat: Mucous membranes are moist.   Neck: No stridor. Cardiovascular: Normal rate, regular rhythm. No murmurs, rubs, or gallops. Respiratory: Normal respiratory effort, decreased breath sounds on the right. Gastrointestinal: Soft and nontender. Normal bowel sounds Musculoskeletal: Nontender with normal range of motion in extremities. No lower extremity tenderness nor edema. Neurologic:  Normal speech and language. No gross focal neurologic deficits are appreciated.  Skin:  Skin is warm, dry and intact. No rash noted. Psychiatric: Mood and affect are normal. Speech and behavior are normal.  ____________________________________________  EKG:  Interpreted by me. Sinus rhythm with a rate of 77 bpm, normal PR interval, normal QRS, normal QT.  ____________________________________________  ED COURSE:  Pertinent labs & imaging results that were available during my care of the patient were reviewed by me and considered in my medical decision making (see chart for details). Patient presents for chest pain and shortness of breath, we will assess with labs and imaging as  indicated.   CHEST TUBE INSERTION Date/Time: 10/11/2016 11:37 AM Performed by: Emily Filbert Authorized by: Daryel November E   Consent:    Consent obtained:  Verbal   Consent given by:  Patient Pre-procedure details:    Skin preparation:  Betadine Sedation:    Sedation type:  Moderate (conscious) sedation Anesthesia (see MAR for exact dosages):    Anesthesia method:  Local infiltration   Local anesthetic:  Lidocaine 1% WITH epi Procedure details:    Placement location:  R lateral   Scalpel size:  11   Tube size (Fr):  12   Ultrasound guidance: no     Tension pneumothorax: no     Tube connected to:  Water seal   Drainage characteristics:  Air only   Suture material:  0 silk   Dressing:  4x4 sterile gauze Post-procedure details:    Post-insertion x-ray findings: tube in good position     Patient tolerance of procedure:  Tolerated well, no immediate complications   ____________________________________________   LABS (pertinent positives/negatives)  Labs Reviewed  BASIC METABOLIC PANEL - Abnormal; Notable for the following:       Result Value   Glucose, Bld 117 (*)    All other components within normal limits  CBC  TROPONIN I  PROTIME-INR    RADIOLOGY Images were viewed by me  Chest x-ray Reveals right-sided pneumothorax  Repeat x-ray reveals improved right-sided pneumothorax status post chest tube insertion ____________________________________________  FINAL ASSESSMENT AND PLAN  Pneumothorax  Plan: Patient's labs and imaging were dictated above. Patient had presented for chest pain secondary to large right-sided pneumothorax. He underwent tube thoracostomy with good resolution in his pneumothorax. I will discuss with general surgery for admission.   Emily Filbert, MD   Note: This note was generated in part or whole with voice recognition software. Voice recognition is usually quite accurate but there are transcription errors that can  and very often do occur. I apologize for any typographical errors that were not detected and corrected.     Emily Filbert, MD 10/11/16 1145

## 2016-10-11 NOTE — H&P (Signed)
Date of Admission:  10/11/2016  Reason for Admission:  Right pneumothorax  History of Present Illness: Aaron Ross is a 61 y.o. male who presents with a one day history of chest pain.  Patient reports that the pain started this morning around 7 am.  He did not have any trauma prior to this pain onset.  Initially thought it was indigestion, but the pain did not improve after taking his antacid, and he presented to ED for evaluation.  He reports his pain felt was something compressing his chest, associated with shortness of breath.  In the ED, he was found to have a right-sided pneumothorax and a chest tube was placed, with interval improved expansion of the right lung.  Past Medical History: Past Medical History:  Diagnosis Date  . GERD (gastroesophageal reflux disease)   . Hypertension Hyperlipidemia      Past Surgical History: Past Surgical History:  Procedure Laterality Date  . COSMETIC SURGERY    . HERNIA REPAIR      Home Medications: Prior to Admission medications   Medication Sig Start Date End Date Taking? Authorizing Provider  aspirin EC 81 MG tablet Take 81 mg by mouth daily.   Yes Historical Provider, MD  finasteride (PROSCAR) 5 MG tablet Take 5 mg by mouth daily. 11/15/15  Yes Historical Provider, MD  losartan (COZAAR) 100 MG tablet Take 1 tablet (100 mg total) by mouth daily. 05/24/16  Yes Ellyn Hack, MD  omeprazole (PRILOSEC) 20 MG capsule Take 1 capsule (20 mg total) by mouth daily. 05/24/16  Yes Ellyn Hack, MD  rosuvastatin (CRESTOR) 5 MG tablet Take 1 tablet (5 mg total) by mouth daily. 11/29/15  Yes Ellyn Hack, MD  sildenafil (VIAGRA) 100 MG tablet Take 1 tablet (100 mg total) by mouth daily as needed for erectile dysfunction. 11/29/14  Yes Dennison Mascot, MD    Allergies: No Known Allergies  Social History:  reports that he has been smoking.  He has been smoking about 1.5 packs per day. He has never used smokeless tobacco. He reports that he drinks  about 0.6 oz of alcohol per week . He reports that he does not use drugs.   Family History: Family History  Problem Relation Age of Onset  . Hypertension Father     Review of Systems: Review of Systems  Constitutional: Negative for chills and fever.  HENT: Negative for hearing loss.   Eyes: Negative for blurred vision.  Respiratory: Positive for shortness of breath. Negative for cough.   Cardiovascular: Positive for chest pain. Negative for leg swelling.  Gastrointestinal: Negative for abdominal pain, heartburn, nausea and vomiting.  Genitourinary: Negative for dysuria and hematuria.  Musculoskeletal: Negative for myalgias.  Skin: Negative for rash.  Neurological: Negative for dizziness.  Psychiatric/Behavioral: Negative for depression.  All other systems reviewed and are negative.   Physical Exam BP (!) 127/92   Pulse (!) 52   Temp 98.5 F (36.9 C) (Oral)   Resp 12   Ht  (1.854 m)   Wt 72.6 kg (160 lb)   SpO2 99%   BMI 21.11 kg/m  CONSTITUTIONAL: No acute distress HEENT:  Normocephalic, atraumatic, extraocular motion intact. NECK: Trachea is midline, and there is no jugular venous distension.  RESPIRATORY:  Lungs are clear, and breath sounds are equal bilaterally with mild decrease on the right side.  Chest tube in place to suction, with positive airleak. CARDIOVASCULAR: Heart is regular without murmurs, gallops, or rubs. GI: The abdomen  is soft, nondistended, nontender to palpation.  MUSCULOSKELETAL:  Normal muscle strength and tone in all four extremities.  No peripheral edema or cyanosis. SKIN: Skin turgor is normal. There are no pathologic skin lesions.  NEUROLOGIC:  Motor and sensation is grossly normal.  Cranial nerves are grossly intact. PSYCH:  Alert and oriented to person, place and time. Affect is normal.  Laboratory Analysis: Results for orders placed or performed during the hospital encounter of 10/11/16 (from the past 24 hour(s))  Basic metabolic  panel     Status: Abnormal   Collection Time: 10/11/16  9:43 AM  Result Value Ref Range   Sodium 137 135 - 145 mmol/L   Potassium 4.1 3.5 - 5.1 mmol/L   Chloride 104 101 - 111 mmol/L   CO2 26 22 - 32 mmol/L   Glucose, Bld 117 (H) 65 - 99 mg/dL   BUN 16 6 - 20 mg/dL   Creatinine, Ser 7.82 0.61 - 1.24 mg/dL   Calcium 9.1 8.9 - 95.6 mg/dL   GFR calc non Af Amer >60 >60 mL/min   GFR calc Af Amer >60 >60 mL/min   Anion gap 7 5 - 15  CBC     Status: None   Collection Time: 10/11/16  9:43 AM  Result Value Ref Range   WBC 9.3 3.8 - 10.6 K/uL   RBC 5.27 4.40 - 5.90 MIL/uL   Hemoglobin 16.0 13.0 - 18.0 g/dL   HCT 21.3 08.6 - 57.8 %   MCV 91.7 80.0 - 100.0 fL   MCH 30.4 26.0 - 34.0 pg   MCHC 33.1 32.0 - 36.0 g/dL   RDW 46.9 62.9 - 52.8 %   Platelets 352 150 - 440 K/uL  Troponin I     Status: None   Collection Time: 10/11/16  9:43 AM  Result Value Ref Range   Troponin I <0.03 <0.03 ng/mL  Protime-INR     Status: None   Collection Time: 10/11/16 10:20 AM  Result Value Ref Range   Prothrombin Time 13.3 11.4 - 15.2 seconds   INR 1.01     Imaging: Dg Chest 1 View  Result Date: 10/11/2016 CLINICAL DATA:  Chest post chest tube insertion. EXAM: CHEST 1 VIEW COMPARISON:  10/11/2016 FINDINGS: Interval placement of a right-sided chest tube. Interval re-expansion of the right lung with near complete resolution of the right pneumothorax. Right perihilar airspace disease likely reflects atelectasis. Left lung is clear. Lungs are hyperinflated likely secondary to COPD. Heart size and mediastinum are stable. No acute osseous abnormality. IMPRESSION: 1. Interval placement of a right-sided chest tube. Interval re-expansion of the right lung with near complete resolution of the right pneumothorax. Right perihilar airspace disease likely reflects atelectasis. Electronically Signed   By: Elige Ko   On: 10/11/2016 11:55   Dg Chest 2 View  Result Date: 10/11/2016 CLINICAL DATA:  61 year old male with  history of chest pain since this morning at 7 a.m. Central chest pressure extending of left side of the chest with shortness of breath. EXAM: CHEST  2 VIEW COMPARISON:  Chest x-ray 11/24/2013. FINDINGS: Large right-sided pneumothorax occupying at least 50% of the volume of the right hemithorax. No definite shift of cardiomediastinal structures to suggest tension. Atelectatic changes throughout the right lung. Widespread emphysematous changes. Diffuse peribronchial cuffing. No acute consolidative airspace disease. No pleural effusions. No evidence of pulmonary edema. Heart size is normal. Upper mediastinal contours are within normal limits. Aortic atherosclerosis. IMPRESSION: 1. Large right-sided pneumothorax. 2. Emphysema. 3. Aortic atherosclerosis.  Critical Value/emergent results were called by telephone at the time of interpretation on 10/11/2016 at 10:18 am to Dr. Nita Sickle, who verbally acknowledged these results. Electronically Signed   By: Trudie Reed M.D.   On: 10/11/2016 10:21    Assessment and Plan: This is a 61 y.o. male who presents with a right pneumothorax, s/p chest tube placement in the ED.  I have independently viewed the patient's imaging studies as well as laboratory studies.  He had a large pneumothorax which has improved after chest tube placement.  Labs are overall normal.  Will admit the patient to the surgical team and discuss with Dr. Thelma Barge with cardiothoracic surgery for management options.  He can have a regular diet and his home medications.  He declined a nicotine patch.  He will have appropriate pain medication combination of tylenol, toradol, and oxycodone.  Chest tube will be continued on suction at 20 cm H20.  Currently he has an airleak and will assess this daily.  Chest x-ray will be ordered for tomorrow to evaluate his pneumothorax.  Discussed with the patient that he would have his chest tube until we know the airleak has resolved and his lung remains expanded.   No surgical intervention is needed at this point, but the patient is aware that if the airleak does not resolve or the pneumothorax does not improve, he may require surgery.   Howie Ill, MD [facility]

## 2016-10-11 NOTE — ED Notes (Signed)
Pt visualized in NAD, resting in bed at this time, respirations even and unlabored, skin warm, dry, and intact.

## 2016-10-11 NOTE — ED Notes (Signed)
Dr. Aleen Campi notified that patient did not receive 1315 scheduled dose of Tylenol and Toradol, pt will resume schedule at 1800.

## 2016-10-11 NOTE — ED Notes (Signed)
This RN to bedside, introduced self to patient. This RN apologized for delay in getting to patient room, explained delayed in critical patient's room at this time. Pt states understanding, pain medication administered. Pt states, "I think something is wrong with this tube". This RN assessed chest tube on the R side, no kinks noted to tubing, water chamber noted to be bubbling at this time.

## 2016-10-12 ENCOUNTER — Inpatient Hospital Stay: Payer: BLUE CROSS/BLUE SHIELD

## 2016-10-12 NOTE — Progress Notes (Signed)
10/12/2016  Subjective: No acute events.  Pt reports much improved chest pain.  No shortness of breath.  Vital signs: Temp:  [97.9 F (36.6 C)-98.6 F (37 C)] 98 F (36.7 C) (04/20 1203) Pulse Rate:  [47-76] 58 (04/20 1203) Resp:  [10-28] 18 (04/20 1203) BP: (95-137)/(61-94) 122/70 (04/20 1203) SpO2:  [91 %-100 %] 96 % (04/20 1203)   Intake/Output: 04/19 0701 - 04/20 0700 In: -  Out: 100 [Urine:100]    Physical Exam: Constitutional: No acute distress Pulm:  No respiratory distress.  Right chest tube in place with airleak in system.  After checking connection, there was a poor connection between chest tube and pleuravac tubing.  Placed better connector and air leak disappeared.   Labs:   Recent Labs  10/11/16 0943  WBC 9.3  HGB 16.0  HCT 48.3  PLT 352    Recent Labs  10/11/16 0943  NA 137  K 4.1  CL 104  CO2 26  GLUCOSE 117*  BUN 16  CREATININE 0.92  CALCIUM 9.1    Recent Labs  10/11/16 1020  LABPROT 13.3  INR 1.01    Imaging: Dg Chest Port 1 View  Result Date: 10/12/2016 CLINICAL DATA:  Of right pneumothorax EXAM: PORTABLE CHEST 1 VIEW COMPARISON:  10/11/2016 FINDINGS: A right chest tube is present. Decreased right pneumothorax since previous study with minimal residual right apical pneumothorax present. Emphysematous changes and scattered fibrosis in the lungs. Small focal area of infiltration or atelectasis in the right mid lung is decreasing since previous study, possibly due to re-expansion. No blunting of costophrenic angles. Normal heart size and pulmonary vascularity. Tortuous aorta. IMPRESSION: Decreasing right pneumothorax with minimal residual right apical pneumothorax since previous study. Decreasing infiltration or atelectasis in the right mid lung. Diffuse emphysematous changes. Electronically Signed   By: Burman Nieves M.D.   On: 10/12/2016 06:44    Assessment/Plan: 61 yo male with right pneumothorax  --will place chest tube to  waterseal today and check repeat CXR tomorrow morning.   --Depending on CXR results, may be able to remove chest tube tomorrow and discharge home.   Howie Ill, MD [facility]

## 2016-10-13 ENCOUNTER — Inpatient Hospital Stay: Payer: BLUE CROSS/BLUE SHIELD

## 2016-10-13 MED ORDER — OXYCODONE HCL 5 MG PO TABS: 5.0000 mg | ORAL_TABLET | ORAL | 0 refills | Status: DC | PRN

## 2016-10-13 NOTE — Discharge Summary (Signed)
Patient ID: Aaron Ross MRN: [id number] DOB/AGE: [birth year] 60 y.o.  Admit date: 10/11/2016 Discharge date: 10/13/2016   Discharge Diagnoses:  Active Problems:   Pneumothorax on right   Procedures:  Right-sided chest tube placement  Hospital Course:  Patient presented to the hospital on 10/11/16 with a spontaneous right sided pneumothorax.  Chest tube was placed by ED physician and patient was admitted to surgical service.  His lung re-expanded successfully and his chest tube was placed to waterseal on 4/20.  Chest tube was removed on 4/21 after continued resolution of pneumothorax.  Post-pull CXR revealed no pneumothorax.  Patient was deemed ready for discharge to home.  Consults: None  Disposition:  Home, self-care  Discharge Instructions    Activity as tolerated - No restrictions    Complete by:  As directed    Call MD for:  difficulty breathing, headache or visual disturbances    Complete by:  As directed    Call MD for:  redness, tenderness, or signs of infection (pain, swelling, redness, odor or green/yellow discharge around incision site)    Complete by:  As directed    Call MD for:  temperature >100.4    Complete by:  As directed    Diet - low sodium heart healthy    Complete by:  As directed    Discharge instructions    Complete by:  As directed    1.  Patient may shower, but do not submerge wound in pool/tub. 2.  Do not go scuba diving 3.  Cannot fly in airplane for 2 weeks.   Driving Restrictions    Complete by:  As directed    Do not drive while taking narcotics for pain control.   Remove dressing in 48 hours    Complete by:  As directed    May apply dry gauze dressing over wound if the skin is still open.     Allergies as of 10/13/2016   No Known Allergies     Medication List    TAKE these medications   aspirin EC 81 MG tablet Take 81 mg by mouth daily.   finasteride 5 MG tablet Commonly known as:  PROSCAR Take 5 mg by mouth daily.   losartan 100 MG  tablet Commonly known as:  COZAAR Take 1 tablet (100 mg total) by mouth daily.   omeprazole 20 MG capsule Commonly known as:  PRILOSEC Take 1 capsule (20 mg total) by mouth daily.   oxyCODONE 5 MG immediate release tablet Commonly known as:  Oxy IR/ROXICODONE Take 1-2 tablets (5-10 mg total) by mouth every 4 (four) hours as needed for moderate pain.   rosuvastatin 5 MG tablet Commonly known as:  CRESTOR Take 1 tablet (5 mg total) by mouth daily.   sildenafil 100 MG tablet Commonly known as:  VIAGRA Take 1 tablet (100 mg total) by mouth daily as needed for erectile dysfunction.      Follow-up Information    Dennison Mascot, MD Follow up.   Specialty:  Family Medicine Why:  Follow up with your PCP at your earliest convenience.

## 2016-10-13 NOTE — Progress Notes (Signed)
10/13/2016  Subjective: No acute events.  Patient's chest tube placed to waterseal yesterday.  No further chest pain or air leaks in system.  Vital signs: Temp:  [97.6 F (36.4 C)-98 F (36.7 C)] 97.9 F (36.6 C) (04/21 0440) Pulse Rate:  [50-58] 51 (04/21 0440) Resp:  [18] 18 (04/21 0440) BP: (122-127)/(70-82) 122/82 (04/21 0440) SpO2:  [96 %] 96 % (04/21 0440)   Intake/Output: 04/20 0701 - 04/21 0700 In: 600 [P.O.:600] Out: 200 [Urine:200]    Physical Exam: Constitutional: No acute distress Pulm:  Lungs clear bilaterally.  Chest tube in place on right side, with no air leak.  Labs:   Recent Labs  10/11/16 0943  WBC 9.3  HGB 16.0  HCT 48.3  PLT 352    Recent Labs  10/11/16 0943  NA 137  K 4.1  CL 104  CO2 26  GLUCOSE 117*  BUN 16  CREATININE 0.92  CALCIUM 9.1    Recent Labs  10/11/16 1020  LABPROT 13.3  INR 1.01    Imaging: Dg Chest Port 1 View  Result Date: 10/13/2016 CLINICAL DATA:  Right pneumothorax. EXAM: PORTABLE CHEST 1 VIEW COMPARISON:  10/12/2016 FINDINGS: The cardiomediastinal silhouette is within normal limits. The lungs remain hyperinflated with underlying emphysematous changes and chronic interstitial coarsening. A right chest tube remains. No definite residual right pneumothorax is identified when comparing with the aid baseline study from 11/24/2013. Right midlung opacity on the prior study has further decreased and may reflect resolving atelectasis. No sizable pleural effusion is seen. An old right clavicle fracture is noted. IMPRESSION: No definite residual right pneumothorax identified. Improving right mid lung aeration. Electronically Signed   By: Sebastian Ache M.D.   On: 10/13/2016 07:38    Assessment/Plan: 61 yo male s/p right chest tube placement for right pneumothorax  --No further pneumothorax on waterseal.  Will remove chest tube this morning and place order for repeat CXR to evaluate.  If no post-pull pneumothorax is present,  then patient will be discharged to home today.   Howie Ill, MD [facility]

## 2016-12-06 ENCOUNTER — Other Ambulatory Visit: Payer: Self-pay | Admitting: Family Medicine

## 2016-12-06 DIAGNOSIS — K219 Gastro-esophageal reflux disease without esophagitis: Secondary | ICD-10-CM

## 2016-12-12 ENCOUNTER — Other Ambulatory Visit: Payer: Self-pay | Admitting: Family Medicine

## 2016-12-12 DIAGNOSIS — I10 Essential (primary) hypertension: Secondary | ICD-10-CM

## 2016-12-12 DIAGNOSIS — K219 Gastro-esophageal reflux disease without esophagitis: Secondary | ICD-10-CM

## 2016-12-17 ENCOUNTER — Other Ambulatory Visit: Payer: Self-pay | Admitting: Family Medicine

## 2016-12-17 DIAGNOSIS — I10 Essential (primary) hypertension: Secondary | ICD-10-CM

## 2016-12-17 DIAGNOSIS — K219 Gastro-esophageal reflux disease without esophagitis: Secondary | ICD-10-CM

## 2016-12-18 ENCOUNTER — Other Ambulatory Visit: Payer: Self-pay | Admitting: Family Medicine

## 2016-12-18 DIAGNOSIS — I10 Essential (primary) hypertension: Secondary | ICD-10-CM

## 2016-12-18 DIAGNOSIS — K219 Gastro-esophageal reflux disease without esophagitis: Secondary | ICD-10-CM

## 2016-12-20 ENCOUNTER — Other Ambulatory Visit: Payer: Self-pay | Admitting: Family Medicine

## 2016-12-20 DIAGNOSIS — I10 Essential (primary) hypertension: Secondary | ICD-10-CM

## 2016-12-20 DIAGNOSIS — K219 Gastro-esophageal reflux disease without esophagitis: Secondary | ICD-10-CM

## 2016-12-21 ENCOUNTER — Telehealth: Payer: Self-pay

## 2016-12-21 DIAGNOSIS — I10 Essential (primary) hypertension: Secondary | ICD-10-CM

## 2016-12-21 DIAGNOSIS — K219 Gastro-esophageal reflux disease without esophagitis: Secondary | ICD-10-CM

## 2016-12-21 MED ORDER — OMEPRAZOLE 20 MG PO CPDR: 20.0000 mg | DELAYED_RELEASE_CAPSULE | Freq: Every day | ORAL | 0 refills | Status: DC

## 2016-12-21 MED ORDER — LOSARTAN POTASSIUM 100 MG PO TABS: 100.0000 mg | ORAL_TABLET | Freq: Every day | ORAL | 0 refills | Status: DC

## 2016-12-21 NOTE — Telephone Encounter (Signed)
Medication has been refilled and sent to [facility] [address]

## 2017-01-17 ENCOUNTER — Ambulatory Visit (INDEPENDENT_AMBULATORY_CARE_PROVIDER_SITE_OTHER): Payer: BLUE CROSS/BLUE SHIELD | Admitting: Family Medicine

## 2017-01-17 ENCOUNTER — Encounter: Payer: Self-pay | Admitting: Family Medicine

## 2017-01-17 VITALS — BP 120/83 | HR 76 | Temp 98.4°F | Resp 16 | Ht 73.0 in | Wt 154.9 lb

## 2017-01-17 DIAGNOSIS — K219 Gastro-esophageal reflux disease without esophagitis: Secondary | ICD-10-CM | POA: Diagnosis not present

## 2017-01-17 DIAGNOSIS — E7800 Pure hypercholesterolemia, unspecified: Secondary | ICD-10-CM | POA: Diagnosis not present

## 2017-01-17 DIAGNOSIS — R739 Hyperglycemia, unspecified: Secondary | ICD-10-CM | POA: Diagnosis not present

## 2017-01-17 DIAGNOSIS — I10 Essential (primary) hypertension: Secondary | ICD-10-CM | POA: Diagnosis not present

## 2017-01-17 LAB — LIPID PANEL
CHOL/HDL RATIO: 3.4 ratio (ref <5.0)
Cholesterol: 211 mg/dL — ABNORMAL HIGH (ref <200)
HDL: 62 mg/dL (ref >40)
LDL CALC: 128 mg/dL — AB (ref <100)
Triglycerides: 106 mg/dL (ref <150)
VLDL: 21 mg/dL (ref <30)

## 2017-01-17 MED ORDER — ROSUVASTATIN CALCIUM 5 MG PO TABS: 5.0000 mg | ORAL_TABLET | Freq: Every day | ORAL | 1 refills | Status: DC

## 2017-01-17 MED ORDER — LOSARTAN POTASSIUM 100 MG PO TABS: 100.0000 mg | ORAL_TABLET | Freq: Every day | ORAL | 0 refills | Status: DC

## 2017-01-17 MED ORDER — OMEPRAZOLE 20 MG PO CPDR: 20.0000 mg | DELAYED_RELEASE_CAPSULE | Freq: Every day | ORAL | 0 refills | Status: DC

## 2017-01-17 NOTE — Progress Notes (Signed)
Name: Aaron Ross   MRN: [id number]    DOB: 05/08/1956   Date:01/17/2017       Progress Note  Subjective  Chief Complaint  Chief Complaint  Patient presents with  . Medication Refill    Hypertension  This is a chronic problem. The problem is unchanged. The problem is uncontrolled. Pertinent negatives include no blurred vision, chest pain, headaches, palpitations or shortness of breath. Past treatments include angiotensin blockers. There are no compliance problems.  There is no history of kidney disease, CAD/MI or CVA.  Gastroesophageal Reflux  He complains of a sore throat (burning in throat with beer or any food). He reports no abdominal pain, no chest pain, no coughing, no heartburn or no nausea. This is a chronic problem. The problem has been unchanged. The symptoms are aggravated by certain foods (tomatoes, pepperoni, V8 juice.). He has tried a PPI for the symptoms. Past procedures do not include an EGD.  Hyperlipidemia  This is a new problem. This is a new diagnosis. The problem is uncontrolled. Pertinent negatives include no chest pain, leg pain, myalgias or shortness of breath. Current antihyperlipidemic treatment includes statins (not takig statin at this time).     Past Medical History:  Diagnosis Date  . GERD (gastroesophageal reflux disease)   . Hypertension     Past Surgical History:  Procedure Laterality Date  . COSMETIC SURGERY    . HERNIA REPAIR      Family History  Problem Relation Age of Onset  . Hypertension Father     Social History   Social History  . Marital status: Divorced    Spouse name: N/A  . Number of children: N/A  . Years of education: N/A   Occupational History  . Not on file.   Social History Main Topics  . Smoking status: Current Every Day Smoker    Packs/day: 1.00    Last attempt to quit: 07/13/2014  . Smokeless tobacco: Never Used  . Alcohol use 0.6 oz/week    1 Standard drinks or equivalent per week     Comment: glass of wine a  day  . Drug use: No  . Sexual activity: Yes   Other Topics Concern  . Not on file   Social History Narrative  . No narrative on file     Current Outpatient Prescriptions:  .  aspirin EC 81 MG tablet, Take 81 mg by mouth daily., Disp: , Rfl:  .  finasteride (PROSCAR) 5 MG tablet, Take 5 mg by mouth daily., Disp: , Rfl: 5 .  losartan (COZAAR) 100 MG tablet, Take 1 tablet (100 mg total) by mouth daily., Disp: 30 tablet, Rfl: 0 .  omeprazole (PRILOSEC) 20 MG capsule, Take 1 capsule (20 mg total) by mouth daily., Disp: 30 capsule, Rfl: 0 .  oxyCODONE (OXY IR/ROXICODONE) 5 MG immediate release tablet, Take 1-2 tablets (5-10 mg total) by mouth every 4 (four) hours as needed for moderate pain., Disp: 10 tablet, Rfl: 0 .  rosuvastatin (CRESTOR) 5 MG tablet, Take 1 tablet (5 mg total) by mouth daily., Disp: 90 tablet, Rfl: 1 .  sildenafil (VIAGRA) 100 MG tablet, Take 1 tablet (100 mg total) by mouth daily as needed for erectile dysfunction., Disp: 5 tablet, Rfl: 11  No Known Allergies   Review of Systems  HENT: Positive for sore throat (burning in throat with beer or any food).   Eyes: Negative for blurred vision.  Respiratory: Negative for cough and shortness of breath.   Cardiovascular: Negative for  chest pain and palpitations.  Gastrointestinal: Negative for abdominal pain, heartburn and nausea.  Musculoskeletal: Negative for myalgias.  Neurological: Negative for headaches.      Objective  Vitals:   01/17/17 1543  BP: 120/83  Pulse: 76  Resp: 16  Temp: 98.4 F (36.9 C)  TempSrc: Oral  SpO2: 96%  Weight: 154 lb 14.4 oz (70.3 kg)  Height: 6\' 1"  (1.854 m)    Physical Exam  Constitutional: He is oriented to person, place, and time and well-developed, well-nourished, and in no distress.  HENT:  Head: Normocephalic and atraumatic.  Eyes: Pupils are equal, round, and reactive to light.  Cardiovascular: Normal rate, regular rhythm, S1 normal, S2 normal and normal heart  sounds.   No murmur heard. Pulmonary/Chest: Effort normal and breath sounds normal.  Abdominal: Soft. Bowel sounds are normal.  Musculoskeletal:       Right ankle: He exhibits no swelling.       Left ankle: He exhibits no swelling.  Neurological: [name] and oriented to person, place, and time.  Nursing note and vitals reviewed.     Assessment & Plan  1. Gastroesophageal reflux disease, esophagitis presence not specified Stable on PPI, refills provided - omeprazole (PRILOSEC) 20 MG capsule; Take 1 capsule (20 mg total) by mouth daily.  Dispense: 90 capsule; Refill: 0  2. Pure hypercholesterolemia Advised to restart taking Crestor, obtain FLP and adjust as above - Lipid panel - rosuvastatin (CRESTOR) 5 MG tablet; Take 1 tablet (5 mg total) by mouth daily.  Dispense: 90 tablet; Refill: 1  3. Essential hypertension Stable on present hypertensive treatment - losartan (COZAAR) 100 MG tablet; Take 1 tablet (100 mg total) by mouth daily.  Dispense: 90 tablet; Refill: 0  4. Hyperglycemia  point-of-care A1c is 5.8%, consistent with prediabetes, educated on dietary and lifestyle changes to prevent progression to diabetes  - POCT glycosylated hemoglobin (Hb A1C)   Aaron Ross Aaron Ross [facility] 01/17/2017 3:55 PM

## 2017-01-22 LAB — POCT GLYCOSYLATED HEMOGLOBIN (HGB A1C): HEMOGLOBIN A1C: 5.8

## 2017-02-27 ENCOUNTER — Ambulatory Visit (INDEPENDENT_AMBULATORY_CARE_PROVIDER_SITE_OTHER): Payer: BLUE CROSS/BLUE SHIELD | Admitting: Family Medicine

## 2017-02-27 ENCOUNTER — Encounter: Payer: Self-pay | Admitting: Family Medicine

## 2017-02-27 VITALS — BP 123/79 | HR 62 | Temp 97.8°F | Resp 16 | Ht 73.0 in | Wt 156.0 lb

## 2017-02-27 DIAGNOSIS — H00015 Hordeolum externum left lower eyelid: Secondary | ICD-10-CM

## 2017-02-27 DIAGNOSIS — H0015 Chalazion left lower eyelid: Secondary | ICD-10-CM | POA: Diagnosis not present

## 2017-02-27 NOTE — Progress Notes (Signed)
Name: Aaron Ross   MRN: [id number]    DOB: [birth year]   Date:02/27/2017       Progress Note  Subjective  Chief Complaint  Chief Complaint  Patient presents with  . Eye Problem    check left eye    Eye Problem   The left eye is affected. This is a new problem. The current episode started 1 to 4 weeks ago (2 weeks ago). There was no injury mechanism. Pertinent negatives include no eye discharge or double vision. He has tried nothing for the symptoms.     Past Medical History:  Diagnosis Date  . GERD (gastroesophageal reflux disease)   . Hypertension     Past Surgical History:  Procedure Laterality Date  . COSMETIC SURGERY    . HERNIA REPAIR      Family History  Problem Relation Age of Onset  . Hypertension Father     Social History   Social History  . Marital status: Divorced    Spouse name: N/A  . Number of children: N/A  . Years of education: N/A   Occupational History  . Not on file.   Social History Main Topics  . Smoking status: Current Every Day Smoker    Packs/day: 1.00    Last attempt to quit: 07/13/2014  . Smokeless tobacco: Never Used  . Alcohol use 0.6 oz/week    1 Standard drinks or equivalent per week     Comment: glass of wine a day  . Drug use: No  . Sexual activity: Yes   Other Topics Concern  . Not on file   Social History Narrative  . No narrative on file     Current Outpatient Prescriptions:  .  losartan (COZAAR) 100 MG tablet, Take 1 tablet (100 mg total) by mouth daily., Disp: 90 tablet, Rfl: 0 .  omeprazole (PRILOSEC) 20 MG capsule, Take 1 capsule (20 mg total) by mouth daily., Disp: 90 capsule, Rfl: 0 .  rosuvastatin (CRESTOR) 5 MG tablet, Take 1 tablet (5 mg total) by mouth daily., Disp: 90 tablet, Rfl: 1 .  sildenafil (VIAGRA) 100 MG tablet, Take 1 tablet (100 mg total) by mouth daily as needed for erectile dysfunction., Disp: 5 tablet, Rfl: 11  No Known Allergies   Review of Systems  Eyes: Negative for double vision and  discharge.      Objective  Vitals:   02/27/17 0847  BP: 123/79  Pulse: 62  Resp: 16  Temp: 97.8 F (36.6 C)  TempSrc: Oral  SpO2: 94%  Weight: 156 lb (70.8 kg)  Height: 6\' 1"  (1.854 m)    Physical Exam  Constitutional: He is well-developed, well-nourished, and in no distress.  Eyes: Left eye exhibits hordeolum. Left eye exhibits no discharge.    Raised dome shaped erythematous mildly pruritic lesion at the lateral base of left lower eyelid. No drainage  Nursing note and vitals reviewed.     Recent Results (from the past 2160 hour(s))  Lipid panel     Status: Abnormal   Collection Time: 01/17/17  4:09 PM  Result Value Ref Range   Cholesterol 211 (H) <200 mg/dL   Triglycerides 409 <811 mg/dL   HDL 62 >91 mg/dL   Total CHOL/HDL Ratio 3.4 <5.0 Ratio   VLDL 21 <30 mg/dL   LDL Cholesterol 478 (H) <100 mg/dL  POCT glycosylated hemoglobin (Hb A1C)     Status: Normal   Collection Time: 01/22/17  2:53 PM  Result Value Ref Range   Hemoglobin A1C  5.8      Assessment & Plan  1. Hordeolum externum of left lower eyelid Advised to apply warm compresses, referral to [facility].   - Ambulatory referral to Ophthalmology   [facility] Asad A. Faylene KurtzShah [facility] 02/27/2017 8:55 AM

## 2017-04-02 DIAGNOSIS — Z23 Encounter for immunization: Secondary | ICD-10-CM | POA: Diagnosis not present

## 2017-04-17 ENCOUNTER — Other Ambulatory Visit: Payer: Self-pay | Admitting: Family Medicine

## 2017-04-17 DIAGNOSIS — I10 Essential (primary) hypertension: Secondary | ICD-10-CM

## 2017-04-17 DIAGNOSIS — K219 Gastro-esophageal reflux disease without esophagitis: Secondary | ICD-10-CM

## 2017-04-18 ENCOUNTER — Other Ambulatory Visit: Payer: Self-pay | Admitting: Family Medicine

## 2017-04-18 ENCOUNTER — Ambulatory Visit (INDEPENDENT_AMBULATORY_CARE_PROVIDER_SITE_OTHER): Payer: BLUE CROSS/BLUE SHIELD | Admitting: Family Medicine

## 2017-04-18 ENCOUNTER — Encounter: Payer: Self-pay | Admitting: Family Medicine

## 2017-04-18 DIAGNOSIS — I10 Essential (primary) hypertension: Secondary | ICD-10-CM

## 2017-04-18 DIAGNOSIS — K219 Gastro-esophageal reflux disease without esophagitis: Secondary | ICD-10-CM | POA: Diagnosis not present

## 2017-04-18 DIAGNOSIS — E7800 Pure hypercholesterolemia, unspecified: Secondary | ICD-10-CM | POA: Diagnosis not present

## 2017-04-18 MED ORDER — OMEPRAZOLE 20 MG PO CPDR: 20.0000 mg | DELAYED_RELEASE_CAPSULE | Freq: Every day | ORAL | 0 refills | Status: DC

## 2017-04-18 MED ORDER — LOSARTAN POTASSIUM 100 MG PO TABS: 100.0000 mg | ORAL_TABLET | Freq: Every day | ORAL | 0 refills | Status: DC

## 2017-04-18 MED ORDER — ROSUVASTATIN CALCIUM 5 MG PO TABS: 5.0000 mg | ORAL_TABLET | Freq: Every day | ORAL | 1 refills | Status: DC

## 2017-04-18 NOTE — Progress Notes (Signed)
Name: Aaron Ross   MRN: [id number]    DOB: May 11, 1956   Date:04/18/2017       Progress Note  Subjective  Chief Complaint  Chief Complaint  Patient presents with  . Medication Refill    Crestor, Acid reflux med, losartan   . Follow-up    3 mnths    Hypertension  This is a chronic problem. The problem is unchanged. The problem is uncontrolled. Pertinent negatives include no blurred vision, chest pain, headaches, palpitations or shortness of breath. Past treatments include angiotensin blockers. There are no compliance problems.  There is no history of kidney disease, CAD/MI or CVA.  Gastroesophageal Reflux  He reports no abdominal pain, no chest pain, no choking, no coughing, no dysphagia, no heartburn or no nausea. This is a chronic problem. The problem has been unchanged. The symptoms are aggravated by certain foods (tomatoes, pepperoni, V8 juice.). He has tried a PPI for the symptoms. Past procedures do not include an EGD.  Hyperlipidemia  This is a new problem. This is a new diagnosis. The problem is uncontrolled. Recent lipid tests were reviewed and are high. Pertinent negatives include no chest pain, leg pain or shortness of breath. Current antihyperlipidemic treatment includes statins.      Past Medical History:  Diagnosis Date  . GERD (gastroesophageal reflux disease)   . Hypertension     Past Surgical History:  Procedure Laterality Date  . COSMETIC SURGERY    . HERNIA REPAIR      Family History  Problem Relation Age of Onset  . Hypertension Father     Social History   Social History  . Marital status: Divorced    Spouse name: N/A  . Number of children: N/A  . Years of education: N/A   Occupational History  . Not on file.   Social History Main Topics  . Smoking status: Current Every Day Smoker    Packs/day: 1.00    Types: Cigarettes  . Smokeless tobacco: Never Used  . Alcohol use 0.6 oz/week    1 Standard drinks or equivalent per week     Comment:  glass of wine a day  . Drug use: No  . Sexual activity: Yes   Other Topics Concern  . Not on file   Social History Narrative  . No narrative on file     Current Outpatient Prescriptions:  .  losartan (COZAAR) 100 MG tablet, Take 1 tablet (100 mg total) by mouth daily., Disp: 90 tablet, Rfl: 0 .  omeprazole (PRILOSEC) 20 MG capsule, Take 1 capsule (20 mg total) by mouth daily., Disp: 90 capsule, Rfl: 0 .  rosuvastatin (CRESTOR) 5 MG tablet, Take 1 tablet (5 mg total) by mouth daily., Disp: 90 tablet, Rfl: 1 .  sildenafil (VIAGRA) 100 MG tablet, Take 1 tablet (100 mg total) by mouth daily as needed for erectile dysfunction. (Patient not taking: Reported on 04/18/2017), Disp: 5 tablet, Rfl: 11  No Known Allergies   Review of Systems  Eyes: Negative for blurred vision.  Respiratory: Negative for cough, choking and shortness of breath.   Cardiovascular: Negative for chest pain and palpitations.  Gastrointestinal: Negative for abdominal pain, dysphagia, heartburn and nausea.  Neurological: Negative for headaches.      Objective  Vitals:   04/18/17 1527  BP: 126/76  Pulse: 73  Resp: 16  Temp: 97.9 F (36.6 C)  TempSrc: Oral  Weight: 161 lb 4.8 oz (73.2 kg)  Height: 6\' 1"  (1.854 m)    Physical Exam  Constitutional: He is oriented to person, place, and time and well-developed, well-nourished, and in no distress.  HENT:  Head: Normocephalic and atraumatic.  Eyes: Pupils are equal, round, and reactive to light.  Cardiovascular: Normal rate, regular rhythm, S1 normal, S2 normal and normal heart sounds.   No murmur heard. Pulmonary/Chest: Effort normal and breath sounds normal.  Abdominal: Soft. Bowel sounds are normal.  Musculoskeletal: He exhibits no edema.       Right ankle: He exhibits no swelling.       Left ankle: He exhibits no swelling.  Neurological: [name] and oriented to person, place, and time.  Psychiatric: Mood, memory, affect and judgment normal.   Nursing note and vitals reviewed.    Recent Results (from the past 2160 hour(s))  POCT glycosylated hemoglobin (Hb A1C)     Status: Normal   Collection Time: 01/22/17  2:53 PM  Result Value Ref Range   Hemoglobin A1C 5.8      Assessment & Plan  1. Essential hypertension Stable on present antihypertensive treatment - losartan (COZAAR) 100 MG tablet; Take 1 tablet (100 mg total) by mouth daily.  Dispense: 90 tablet; Refill: 0  2. Gastroesophageal reflux disease, esophagitis presence not specified  - omeprazole (PRILOSEC) 20 MG capsule; Take 1 capsule (20 mg total) by mouth daily.  Dispense: 90 capsule; Refill: 0  3. Pure hypercholesterolemia  - rosuvastatin (CRESTOR) 5 MG tablet; Take 1 tablet (5 mg total) by mouth daily.  Dispense: 90 tablet; Refill: 1 - Lipid panel   Amair Shrout Asad A. Faylene KurtzShah [facility] 04/18/2017 3:40 PM

## 2017-06-13 DIAGNOSIS — R42 Dizziness and giddiness: Secondary | ICD-10-CM | POA: Diagnosis not present

## 2017-07-16 ENCOUNTER — Other Ambulatory Visit: Payer: Self-pay | Admitting: Family Medicine

## 2017-07-16 DIAGNOSIS — I10 Essential (primary) hypertension: Secondary | ICD-10-CM

## 2017-07-22 ENCOUNTER — Ambulatory Visit: Payer: BLUE CROSS/BLUE SHIELD | Admitting: Family Medicine

## 2017-07-22 ENCOUNTER — Encounter: Payer: Self-pay | Admitting: Family Medicine

## 2017-07-22 VITALS — BP 116/72 | HR 76 | Temp 98.1°F | Resp 16 | Wt 162.3 lb

## 2017-07-22 DIAGNOSIS — K219 Gastro-esophageal reflux disease without esophagitis: Secondary | ICD-10-CM

## 2017-07-22 DIAGNOSIS — Z720 Tobacco use: Secondary | ICD-10-CM

## 2017-07-22 MED ORDER — PANTOPRAZOLE SODIUM 40 MG PO TBEC: 40.0000 mg | DELAYED_RELEASE_TABLET | Freq: Every day | ORAL | 2 refills | Status: DC

## 2017-07-22 MED ORDER — VARENICLINE TARTRATE 1 MG PO TABS: 1.0000 mg | ORAL_TABLET | Freq: Two times a day (BID) | ORAL | 0 refills | Status: DC

## 2017-07-22 NOTE — Progress Notes (Signed)
Name: Aaron Ross   MRN: [id number]    DOB: 11/16/1955   Date:07/22/2017       Progress Note  Subjective  Chief Complaint  Chief Complaint  Patient presents with  . Gastroesophageal Reflux    Pt states current meds is not working   . Hypertension    losartan recall; need to change. Pt reported that he felt light headed taking the whole pill but start cutting in half and no dizziness and lightheaedness  . Hyperlipidemia  . Medication Refill    Gastroesophageal Reflux  He complains of heartburn. He reports no abdominal pain, no chest pain, no choking, no coughing or no nausea. This is a chronic problem. The problem has been unchanged. The symptoms are aggravated by certain foods (most foods bring on heartburn, worse with [place], spicy things.). Risk factors include smoking/tobacco exposure. He has tried a PPI for the symptoms. The treatment provided moderate relief. Past procedures do not include an EGD.    Pt. Presents for medication refill on Chantix, he has quit smoking since last Monday (01/21), he was smoking 2 PPD and smoked for approximately 45 years. He is currently taking the Chantix 1 mg BID prescribed by previous PCP and needs a refill.      Past Medical History:  Diagnosis Date  . GERD (gastroesophageal reflux disease)   . Hypertension     Past Surgical History:  Procedure Laterality Date  . COSMETIC SURGERY    . HERNIA REPAIR      Family History  Problem Relation Age of Onset  . Hypertension Father     Social History   Socioeconomic History  . Marital status: Divorced    Spouse name: Not on file  . Number of children: Not on file  . Years of education: Not on file  . Highest education level: Not on file  Social Needs  . Financial resource strain: Not on file  . Food insecurity - worry: Not on file  . Food insecurity - inability: Not on file  . Transportation needs - medical: Not on file  . Transportation needs - non-medical: Not on file   Occupational History  . Not on file  Tobacco Use  . Smoking status: Former Smoker    Packs/day: 1.00    Types: Cigarettes    Last attempt to quit: 07/15/2017    Years since quitting: 0.0  . Smokeless tobacco: Never Used  Substance and Sexual Activity  . Alcohol use: Yes    Alcohol/week: 0.6 oz    Types: 1 Standard drinks or equivalent per week    Comment: glass of wine a day  . Drug use: No  . Sexual activity: Yes  Other Topics Concern  . Not on file  Social History Narrative  . Not on file     Current Outpatient Medications:  .  losartan (COZAAR) 100 MG tablet, Take 1 tablet (100 mg total) by mouth daily. (Patient taking differently: Take 50 mg by mouth daily. ), Disp: 90 tablet, Rfl: 0 .  omeprazole (PRILOSEC) 20 MG capsule, Take 1 capsule (20 mg total) by mouth daily., Disp: 90 capsule, Rfl: 0 .  rosuvastatin (CRESTOR) 5 MG tablet, Take 1 tablet (5 mg total) by mouth daily., Disp: 90 tablet, Rfl: 1 .  sildenafil (VIAGRA) 100 MG tablet, Take 1 tablet (100 mg total) by mouth daily as needed for erectile dysfunction. (Patient not taking: Reported on 04/18/2017), Disp: 5 tablet, Rfl: 11  No Known Allergies   Review of Systems  Respiratory: Negative for cough and choking.   Cardiovascular: Negative for chest pain.  Gastrointestinal: Positive for heartburn. Negative for abdominal pain and nausea.      Objective  Vitals:   07/22/17 1515  BP: 116/72  Pulse: 76  Resp: 16  Temp: 98.1 F (36.7 C)  TempSrc: Oral  SpO2: 97%  Weight: 162 lb 4.8 oz (73.6 kg)    Physical Exam  Constitutional: He is oriented to person, place, and time and well-developed, well-nourished, and in no distress.  HENT:  Head: Normocephalic and atraumatic.  Cardiovascular: Normal rate, regular rhythm and normal heart sounds.  No murmur heard. Pulmonary/Chest: Effort normal and breath sounds normal. He has no wheezes.  Abdominal: Soft. Bowel sounds are normal. There is no tenderness.   Neurological: [name] and oriented to person, place, and time.  Psychiatric: Mood, memory, affect and judgment normal.  Nursing note and vitals reviewed.      No results found for this or any previous visit (from the past 2160 hour(s)).   Assessment & Plan  1. Gastroesophageal reflux disease, esophagitis presence not specified DC omeprazole and started pantoprazole, if reflux symptoms still recur, consider endoscopy - pantoprazole (PROTONIX) 40 MG tablet; Take 1 tablet (40 mg total) by mouth daily.  Dispense: 30 tablet; Refill: 2  2. Currently attempting to quit smoking We'll prescribe Chantix 1 mg twice a day to be taken for next 60 days, has quit smoking since last week, follow-up in 2 months - varenicline (CHANTIX CONTINUING MONTH PAK) 1 MG tablet; Take 1 tablet (1 mg total) by mouth 2 (two) times daily.  Dispense: 120 tablet; Refill: 0   Aaron Ross Asad A. Faylene Kurtz [facility] 07/22/2017 3:46 PM

## 2017-07-24 ENCOUNTER — Other Ambulatory Visit: Payer: Self-pay | Admitting: Family Medicine

## 2017-07-24 DIAGNOSIS — I10 Essential (primary) hypertension: Secondary | ICD-10-CM

## 2017-10-16 ENCOUNTER — Other Ambulatory Visit: Payer: Self-pay

## 2017-10-16 DIAGNOSIS — K219 Gastro-esophageal reflux disease without esophagitis: Secondary | ICD-10-CM

## 2017-10-16 MED ORDER — PANTOPRAZOLE SODIUM 40 MG PO TBEC: 40.0000 mg | DELAYED_RELEASE_TABLET | Freq: Every day | ORAL | 0 refills | Status: DC

## 2017-10-16 NOTE — Telephone Encounter (Signed)
Refill request for general medication. Pantoprazole to [facility]  Last office visit 07/22/2017   Follow up on 10/23/2017   

## 2017-10-23 ENCOUNTER — Ambulatory Visit: Payer: BLUE CROSS/BLUE SHIELD | Admitting: Family Medicine

## 2017-10-23 ENCOUNTER — Encounter: Payer: Self-pay | Admitting: Family Medicine

## 2017-10-23 VITALS — BP 120/86 | HR 69 | Resp 14 | Ht 73.0 in | Wt 170.7 lb

## 2017-10-23 DIAGNOSIS — I10 Essential (primary) hypertension: Secondary | ICD-10-CM

## 2017-10-23 DIAGNOSIS — J431 Panlobular emphysema: Secondary | ICD-10-CM | POA: Diagnosis not present

## 2017-10-23 DIAGNOSIS — Z114 Encounter for screening for human immunodeficiency virus [HIV]: Secondary | ICD-10-CM

## 2017-10-23 DIAGNOSIS — R739 Hyperglycemia, unspecified: Secondary | ICD-10-CM

## 2017-10-23 DIAGNOSIS — K219 Gastro-esophageal reflux disease without esophagitis: Secondary | ICD-10-CM | POA: Diagnosis not present

## 2017-10-23 DIAGNOSIS — I700 Atherosclerosis of aorta: Secondary | ICD-10-CM | POA: Insufficient documentation

## 2017-10-23 DIAGNOSIS — Z1159 Encounter for screening for other viral diseases: Secondary | ICD-10-CM | POA: Diagnosis not present

## 2017-10-23 DIAGNOSIS — Z1211 Encounter for screening for malignant neoplasm of colon: Secondary | ICD-10-CM | POA: Diagnosis not present

## 2017-10-23 DIAGNOSIS — E7800 Pure hypercholesterolemia, unspecified: Secondary | ICD-10-CM | POA: Diagnosis not present

## 2017-10-23 LAB — CBC WITH DIFFERENTIAL/PLATELET
BASOS PCT: 0.9 %
Basophils Absolute: 79 cells/uL (ref 0–200)
Eosinophils Absolute: 431 cells/uL (ref 15–500)
Eosinophils Relative: 4.9 %
HEMATOCRIT: 43.5 % (ref 38.5–50.0)
HEMOGLOBIN: 15 g/dL (ref 13.2–17.1)
LYMPHS ABS: 2860 {cells}/uL (ref 850–3900)
MCH: 30.7 pg (ref 27.0–33.0)
MCHC: 34.5 g/dL (ref 32.0–36.0)
MCV: 89.1 fL (ref 80.0–100.0)
MPV: 10.9 fL (ref 7.5–12.5)
Monocytes Relative: 13.5 %
NEUTROS ABS: 4242 {cells}/uL (ref 1500–7800)
NEUTROS PCT: 48.2 %
Platelets: 292 10*3/uL (ref 140–400)
RBC: 4.88 10*6/uL (ref 4.20–5.80)
RDW: 13.3 % (ref 11.0–15.0)
Total Lymphocyte: 32.5 %
WBC: 8.8 10*3/uL (ref 3.8–10.8)
WBCMIX: 1188 {cells}/uL — AB (ref 200–950)

## 2017-10-23 MED ORDER — LOSARTAN POTASSIUM 100 MG PO TABS: 100.0000 mg | ORAL_TABLET | Freq: Every day | ORAL | 1 refills | Status: DC

## 2017-10-23 MED ORDER — PANTOPRAZOLE SODIUM 40 MG PO TBEC: 40.0000 mg | DELAYED_RELEASE_TABLET | Freq: Every day | ORAL | 1 refills | Status: DC

## 2017-10-23 NOTE — Progress Notes (Signed)
Name: Aaron Ross   MRN: [id number]    DOB: November 29, 1955   Date:10/23/2017       Progress Note  Subjective  Chief Complaint  Chief Complaint  Patient presents with  . Hypertension  . Hyperlipidemia  . Medication Refill  . Gastroesophageal Reflux    HPI  HTN: he takes medication as prescribed and denies side effects, no chest pain or palpitation.  Hyperlipidemia: taking crestor and denies side effects of medications  GERD: he has been taking pantoprazole for a long time , discussed side effects of medication including colitis, heart disease or osteoporosis.   Hyperglycemia: he denies polyphagia, polyuria or polydipsia we will check hgbA1C  Emphysema: long history of tobacco use and emphysematous change on CXR done 10/11/2017. He quit smoking 06/2017, he denies wheezing, SOB only with strenuous activity.    Patient Active Problem List   Diagnosis Date Noted  . History of pneumothorax 10/11/2016  . Hyperlipidemia 11/29/2015  . GERD (gastroesophageal reflux disease) 11/29/2015  . HTN (hypertension) 01/11/2015  . COPD (chronic obstructive pulmonary disease) (HCC) 01/11/2015  . Erectile dysfunction 01/11/2015  . Hyperglycemia 01/11/2015    Past Surgical History:  Procedure Laterality Date  . COSMETIC SURGERY    . HERNIA REPAIR      Family History  Problem Relation Age of Onset  . Hypertension Father     Social History   Socioeconomic History  . Marital status: Divorced    Spouse name: Not on file  . Number of children: Not on file  . Years of education: Not on file  . Highest education level: Not on file  Occupational History  . Not on file  Social Needs  . Financial resource strain: Not on file  . Food insecurity:    Worry: Not on file    Inability: Not on file  . Transportation needs:    Medical: Not on file    Non-medical: Not on file  Tobacco Use  . Smoking status: Former Smoker    Packs/day: 2.00    Years: 43.00    Pack years: 86.00    Types:  Cigarettes    Last attempt to quit: 07/15/2017    Years since quitting: 0.2  . Smokeless tobacco: Never Used  Substance and Sexual Activity  . Alcohol use: Yes    Alcohol/week: 4.8 oz    Types: 8 Standard drinks or equivalent per week  . Drug use: No  . Sexual activity: Yes    Partners: Female    Birth control/protection: Surgical  Lifestyle  . Physical activity:    Days per week: Not on file    Minutes per session: Not on file  . Stress: Not on file  Relationships  . Social connections:    Talks on phone: Not on file    Gets together: Not on file    Attends religious service: Not on file    Active member of club or organization: Not on file    Attends meetings of clubs or organizations: Not on file    Relationship status: Not on file  . Intimate partner violence:    Fear of current or ex partner: Not on file    Emotionally abused: Not on file    Physically abused: Not on file    Forced sexual activity: Not on file  Other Topics Concern  . Not on file  Social History Narrative   Divorced, never had children   He works full time in [place], [employer]  Current Outpatient Medications:  .  losartan (COZAAR) 100 MG tablet, TAKE 1 TABLET BY MOUTH EVERY DAY, Disp: 90 tablet, Rfl: 0 .  pantoprazole (PROTONIX) 40 MG tablet, Take 1 tablet (40 mg total) by mouth daily., Disp: 30 tablet, Rfl: 0 .  rosuvastatin (CRESTOR) 5 MG tablet, Take 1 tablet (5 mg total) by mouth daily., Disp: 90 tablet, Rfl: 1 .  sildenafil (VIAGRA) 100 MG tablet, Take 1 tablet (100 mg total) by mouth daily as needed for erectile dysfunction., Disp: 5 tablet, Rfl: 11  No Known Allergies   ROS  Constitutional: Negative for fever or weight change.  Respiratory: Negative for cough and shortness of breath.   Cardiovascular: Negative for chest pain or palpitations.  Gastrointestinal: Negative for abdominal pain, no bowel changes.  Musculoskeletal: Negative for gait problem or joint swelling.  Skin: Negative  for rash.  Neurological: Negative for dizziness or headache.  No other specific complaints in a complete review of systems (except as listed in HPI above).  Objective  Vitals:   10/23/17 1507  BP: 120/86  Pulse: 69  Resp: 14  SpO2: 95%  Weight: 170 lb 11.2 oz (77.4 kg)  Height:  (1.854 m)    Body mass index is 22.52 kg/m.  Physical Exam  Constitutional: Patient appears well-developed and well-nourished. Obese  No distress.  HEENT: head atraumatic, normocephalic, pupils equal and reactive to light,neck supple, throat within normal limits Cardiovascular: Normal rate, regular rhythm and normal heart sounds.  No murmur heard. No BLE edema. Pulmonary/Chest: Effort normal and breath sounds normal. No respiratory distress. Abdominal: Soft.  There is no tenderness. Psychiatric: Patient has a normal mood and affect. behavior is normal. Judgment and thought content normal.  PHQ2/9: Depression screen [facility] 2/9 07/22/2017 04/18/2017 02/27/2017 01/17/2017 11/29/2015  Decreased Interest 0 0 0 0 0  Down, Depressed, Hopeless 0 0 0 0 0  PHQ - 2 Score 0 0 0 0 0     Fall Risk: Fall Risk  10/23/2017 10/23/2017 07/22/2017 04/18/2017 02/27/2017  Falls in the past year? No No No No No      Assessment & Plan  1. Panlobular emphysema (HCC)  - COMPLETE METABOLIC PANEL WITH GFR  2. Need for hepatitis C screening test  - Hepatitis C antibody  3. Encounter for screening for HIV  - HIV antibody  4. Colon cancer screening  - Cologuard  5. Gastroesophageal reflux disease, esophagitis presence not specified  Discussed long term use of PPI  - pantoprazole (PROTONIX) 40 MG tablet; Take 1 tablet (40 mg total) by mouth daily.  Dispense: 90 tablet; Refill: 1  6. Essential hypertension  - losartan (COZAAR) 100 MG tablet; Take 1 tablet (100 mg total) by mouth daily.  Dispense: 90 tablet; Refill: 1  7. Pure hypercholesterolemia  - Lipid panel - CBC with Differential/Platelet  8.  Hyperglycemia  - Hemoglobin A1c

## 2017-10-24 LAB — COMPLETE METABOLIC PANEL WITH GFR
AG Ratio: 1.9 (calc) (ref 1.0–2.5)
ALBUMIN MSPROF: 4.7 g/dL (ref 3.6–5.1)
ALKALINE PHOSPHATASE (APISO): 80 U/L (ref 40–115)
ALT: 25 U/L (ref 9–46)
AST: 28 U/L (ref 10–35)
BILIRUBIN TOTAL: 0.4 mg/dL (ref 0.2–1.2)
BUN: 17 mg/dL (ref 7–25)
CHLORIDE: 102 mmol/L (ref 98–110)
CO2: 28 mmol/L (ref 20–32)
Calcium: 9.5 mg/dL (ref 8.6–10.3)
Creat: 1.04 mg/dL (ref 0.70–1.25)
GFR, EST AFRICAN AMERICAN: 90 mL/min/{1.73_m2} (ref >=60)
GFR, Est Non African American: 78 mL/min/{1.73_m2} (ref >=60)
GLOBULIN: 2.5 g/dL (ref 1.9–3.7)
Glucose, Bld: 96 mg/dL (ref 65–139)
Potassium: 4.3 mmol/L (ref 3.5–5.3)
SODIUM: 139 mmol/L (ref 135–146)
TOTAL PROTEIN: 7.2 g/dL (ref 6.1–8.1)

## 2017-10-24 LAB — HEMOGLOBIN A1C
HEMOGLOBIN A1C: 5.6 %{Hb} (ref <5.7)
Mean Plasma Glucose: 114 (calc)
eAG (mmol/L): 6.3 (calc)

## 2017-10-24 LAB — LIPID PANEL
CHOL/HDL RATIO: 2.5 (calc) (ref <5.0)
Cholesterol: 161 mg/dL (ref <200)
HDL: 64 mg/dL (ref >40)
LDL CHOLESTEROL (CALC): 72 mg/dL
NON-HDL CHOLESTEROL (CALC): 97 mg/dL (ref <130)
Triglycerides: 169 mg/dL — ABNORMAL HIGH (ref <150)

## 2017-10-24 LAB — HIV ANTIBODY (ROUTINE TESTING W REFLEX): HIV 1&2 Ab, 4th Generation: NONREACTIVE

## 2017-10-24 LAB — HEPATITIS C ANTIBODY
HEP C AB: NONREACTIVE
SIGNAL TO CUT-OFF: 0.01 (ref <1.00)

## 2017-10-29 DIAGNOSIS — Z1211 Encounter for screening for malignant neoplasm of colon: Secondary | ICD-10-CM | POA: Diagnosis not present

## 2017-11-13 ENCOUNTER — Encounter: Payer: Self-pay | Admitting: Family Medicine

## 2017-11-13 LAB — COLOGUARD: Cologuard: NEGATIVE

## 2018-01-07 DIAGNOSIS — H6123 Impacted cerumen, bilateral: Secondary | ICD-10-CM | POA: Diagnosis not present

## 2018-01-07 DIAGNOSIS — H902 Conductive hearing loss, unspecified: Secondary | ICD-10-CM | POA: Diagnosis not present

## 2018-01-15 ENCOUNTER — Other Ambulatory Visit: Payer: Self-pay

## 2018-01-15 DIAGNOSIS — E7800 Pure hypercholesterolemia, unspecified: Secondary | ICD-10-CM

## 2018-01-15 MED ORDER — ROSUVASTATIN CALCIUM 5 MG PO TABS
5.0000 mg | ORAL_TABLET | Freq: Every day | ORAL | 0 refills | Status: DC
Start: 2018-01-15 — End: 2018-04-12

## 2018-01-15 NOTE — Telephone Encounter (Signed)
Refill Request for Cholesterol medication. Rosuvastatin to [facility]  Last appt: 10/23/2017   Lab Results  Component Value Date   CHOL 161 10/23/2017   HDL 64 10/23/2017   LDLCALC 72 10/23/2017   TRIG 169 (H) 10/23/2017   CHOLHDL 2.5 10/23/2017    Follow up on 04/29/2018

## 2018-04-08 ENCOUNTER — Encounter: Payer: Self-pay | Admitting: Nurse Practitioner

## 2018-04-08 ENCOUNTER — Ambulatory Visit: Payer: BLUE CROSS/BLUE SHIELD | Admitting: Nurse Practitioner

## 2018-04-08 VITALS — BP 120/78 | HR 79 | Temp 98.0°F | Resp 12 | Ht 73.0 in | Wt 163.8 lb

## 2018-04-08 DIAGNOSIS — R82998 Other abnormal findings in urine: Secondary | ICD-10-CM

## 2018-04-08 DIAGNOSIS — N400 Enlarged prostate without lower urinary tract symptoms: Secondary | ICD-10-CM

## 2018-04-08 DIAGNOSIS — R52 Pain, unspecified: Secondary | ICD-10-CM | POA: Diagnosis not present

## 2018-04-08 DIAGNOSIS — R5383 Other fatigue: Secondary | ICD-10-CM

## 2018-04-08 DIAGNOSIS — R300 Dysuria: Secondary | ICD-10-CM | POA: Diagnosis not present

## 2018-04-08 DIAGNOSIS — Z23 Encounter for immunization: Secondary | ICD-10-CM

## 2018-04-08 LAB — POCT URINALYSIS DIPSTICK
Bilirubin, UA: NEGATIVE
GLUCOSE UA: NEGATIVE
KETONES UA: NEGATIVE
NITRITE UA: POSITIVE
Odor: NORMAL
PROTEIN UA: NEGATIVE
SPEC GRAV UA: 1.02 (ref 1.010–1.025)
Urobilinogen, UA: 0.2 E.U./dL
pH, UA: 6.5 (ref 5.0–8.0)

## 2018-04-08 MED ORDER — SULFAMETHOXAZOLE-TRIMETHOPRIM 800-160 MG PO TABS: 1.0000 | ORAL_TABLET | Freq: Two times a day (BID) | ORAL | 0 refills | Status: DC

## 2018-04-08 NOTE — Patient Instructions (Addendum)
- Please take antibiotic twice a day with food on your stomach -Please do eat yogurt daily or take a probiotic daily for the next three months to replace good gut bacteria from the antibiotic. We want to replace the healthy germs in the gut. If you notice foul, watery diarrhea in the next two months, schedule an appointment RIGHT AWAY   Prostatitis Prostatitis is swelling or inflammation of the prostate gland. The prostate is a walnut-sized gland that is involved in the production of semen. It is located below a man's bladder, in front of the rectum. There are four types of prostatitis:  Chronic nonbacterial prostatitis. This is the most common type of prostatitis. It may be associated with a viral infection or autoimmune disorder.  Acute bacterial prostatitis. This is the least common type of prostatitis. It starts quickly and is usually associated with a bladder infection, high fever, and shaking chills. It can occur at any age.  Chronic bacterial prostatitis. This type usually results from acute bacterial prostatitis that happens repeatedly (is recurrent) or has not been treated properly. It can occur in men of any age but is most common among middle-aged men whose prostate has begun to get larger. The symptoms are not as severe as symptoms caused by acute bacterial prostatitis.  Prostatodynia or chronic pelvic pain syndrome (CPPS). This type is also called pelvic floor disorder. It is associated with increased muscular tone in the pelvis surrounding the prostate.  What are the causes? Bacterial prostatitis is caused by infection from bacteria. Chronic nonbacterial prostatitis may be caused by:  Urinary tract infections (UTIs).  Nerve damage.  A response by the body's disease-fighting system (autoimmune response).  Chemicals in the urine.  The causes of the other types of prostatitis are usually not known. What are the signs or symptoms? Symptoms of this condition vary depending upon  the type of prostatitis. If you have acute bacterial prostatitis, you may experience:  Urinary symptoms, such as: ? Painful urination. ? Burning during urination. ? Frequent and sudden urges to urinate. ? Inability to start urinating. ? A weak or interrupted stream of urine.  Vomiting.  Nausea.  Fever.  Chills.  Inability to empty the bladder completely.  Pain in the: ? Muscles or joints. ? Lower back. ? Lower abdomen.  If you have any of the other types of prostatitis, you may experience:  Urinary symptoms, such as: ? Sudden urges to urinate. ? Frequent urination. ? Difficulty starting urination. ? Weak urine stream. ? Dribbling after urination.  Discharge from the urethra. The urethra is a tube that opens at the end of the penis.  Pain in the: ? Testicles. ? Penis or tip of the penis. ? Rectum. ? Area in front of the rectum and below the scrotum (perineum).  Problems with sexual function.  Painful ejaculation.  Bloody semen.  How is this diagnosed? This condition may be diagnosed based on:  A physical and medical exam.  Your symptoms.  A urine test to check for bacteria.  An exam in which a health care provider uses a finger to feel the prostate (digital rectal exam).  A test of a sample of semen.  Blood tests.  Ultrasound.  Removal of prostate tissue to be examined under a microscope (biopsy).  Tests to check how your body handles urine (urodynamic tests).  A test to look inside your bladder or urethra (cystoscopy).  How is this treated? Treatment for this condition depends on the type of prostatitis. Treatment may  involve:  Medicines to relieve pain or inflammation.  Medicines to help relax your muscles.  Physical therapy.  Heat therapy.  Techniques to help you control certain body functions (biofeedback).  Relaxation exercises.  Antibiotic medicine, if your condition is caused by bacteria.  Warm water baths (sitz baths). Sitz  baths help with relaxing your pelvic floor muscles, which helps to relieve pressure on the prostate.  Follow these instructions at home:  Take over-the-counter and prescription medicines only as told by your health care provider.  If you were prescribed an antibiotic, take it as told by your health care provider. Do not stop taking the antibiotic even if you start to feel better.  If physical therapy, biofeedback, or relaxation exercises were prescribed, do exercises as instructed.  Take sitz baths as directed by your health care provider. For a sitz bath, sit in warm water that is deep enough to cover your hips and buttocks.  Keep all follow-up visits as told by your health care provider. This is important. Contact a health care provider if:  Your symptoms get worse.  You have a fever. Get help right away if:  You have chills.  You feel nauseous.  You vomit.  You feel light-headed or feel like you are going to faint.  You are unable to urinate.  You have blood or blood clots in your urine. This information is not intended to replace advice given to you by your health care provider. Make sure you discuss any questions you have with your health care provider. Document Released: 06/08/2000 Document Revised: 03/01/2016 Document Reviewed: 03/01/2016 Elsevier Interactive Patient Education  2017 [employer].

## 2018-04-08 NOTE — Progress Notes (Addendum)
Name: Aaron Ross   MRN: [id number]    DOB: [birth year]   Date:04/08/2018       Progress Note  Subjective  Chief Complaint  Chief Complaint  Patient presents with  . Urinary Tract Infection    onset sunday, burning with urination and no energy  . Joint Pain    onset saturday all over    HPI  Since Saturday has been having dysuria, fatigue, muscle aches, joint pain, felt feverish- states Sunday night soaked bed from sweating. Has been taking nyquil which has helped him sleep. Patient endorses dysuria started Sunday.   No nasal congestion, sore throat, ear pain, tinnitus, shortness of breath, chest pain, abdominal pain, nausea, vomiting, diarrhea.   No prostate surgeries. Has not been sexually active in over a month as fiance has been [place] but is in a monogamous relationship.   Patient Active Problem List   Diagnosis Date Noted  . Atherosclerosis of aorta (HCC) 10/23/2017  . History of pneumothorax 10/11/2016  . Hyperlipidemia 11/29/2015  . GERD (gastroesophageal reflux disease) 11/29/2015  . HTN (hypertension) 01/11/2015  . COPD (chronic obstructive pulmonary disease) (HCC) 01/11/2015  . Erectile dysfunction 01/11/2015  . Hyperglycemia 01/11/2015    Past Medical History:  Diagnosis Date  . GERD (gastroesophageal reflux disease)   . Hypertension     Past Surgical History:  Procedure Laterality Date  . COSMETIC SURGERY    . HERNIA REPAIR      Social History   Tobacco Use  . Smoking status: Former Smoker    Packs/day: 2.00    Years: 43.00    Pack years: 86.00    Types: Cigarettes    Last attempt to quit: 07/15/2017    Years since quitting: 0.7  . Smokeless tobacco: Never Used  Substance Use Topics  . Alcohol use: Yes    Alcohol/week: 8.0 standard drinks    Types: 8 Standard drinks or equivalent per week     Current Outpatient Medications:  .  losartan (COZAAR) 100 MG tablet, Take 1 tablet (100 mg total) by mouth daily. (Patient not taking: Reported on  04/08/2018), Disp: 90 tablet, Rfl: 1 .  pantoprazole (PROTONIX) 40 MG tablet, Take 1 tablet (40 mg total) by mouth daily. (Patient not taking: Reported on 04/08/2018), Disp: 90 tablet, Rfl: 1 .  rosuvastatin (CRESTOR) 5 MG tablet, Take 1 tablet (5 mg total) by mouth daily. (Patient not taking: Reported on 04/08/2018), Disp: 90 tablet, Rfl: 0 .  sildenafil (VIAGRA) 100 MG tablet, Take 1 tablet (100 mg total) by mouth daily as needed for erectile dysfunction. (Patient not taking: Reported on 04/08/2018), Disp: 5 tablet, Rfl: 11 .  sulfamethoxazole-trimethoprim (BACTRIM DS,SEPTRA DS) 800-160 MG tablet, Take 1 tablet by mouth 2 (two) times daily., Disp: 84 tablet, Rfl: 0  No Known Allergies  ROS   No other specific complaints in a complete review of systems (except as listed in HPI above).  Objective  Vitals:   04/08/18 1144  BP: 120/78  Pulse: 79  Resp: 12  Temp: 98 F (36.7 C)  TempSrc: Oral  SpO2: 96%  Weight: 163 lb 12.8 oz (74.3 kg)  Height: 6\' 1"  (1.854 m)   Body mass index is 21.61 kg/m.  Nursing Note and Vital Signs reviewed.  Physical Exam  Constitutional: He is oriented to person, place, and time. He appears well-developed and well-nourished. No distress.  Cardiovascular: Normal heart sounds and intact distal pulses.  Pulmonary/Chest: Effort normal and breath sounds normal.  Abdominal: Soft. Bowel sounds are  normal. There is no tenderness.  No CVA tenderness  Genitourinary: Rectum normal. Prostate is enlarged and tender (mild discomfort noted).  Musculoskeletal: Normal range of motion. He exhibits no edema or deformity.  Neurological: [name] and oriented to person, place, and time.  Skin: Skin is warm and dry. No rash noted.  Psychiatric: He has a normal mood and affect. His behavior is normal. Judgment and thought content normal.      Results for orders placed or performed in visit on 04/08/18 (from the past 48 hour(s))  POCT urinalysis dipstick     Status:  Abnormal   Collection Time: 04/08/18 11:49 AM  Result Value Ref Range   Color, UA drk yellow    Clarity, UA cloudy    Glucose, UA Negative Negative   Bilirubin, UA neg    Ketones, UA neg    Spec Grav, UA 1.020 1.010 - 1.025   Blood, UA 3+    pH, UA 6.5 5.0 - 8.0   Protein, UA Negative Negative   Urobilinogen, UA 0.2 0.2 or 1.0 E.U./dL   Nitrite, UA positive    Leukocytes, UA Moderate (2+) (A) Negative   Appearance cloudy    Odor normal     Assessment & Plan    1. Leukocytes in urine  - sulfamethoxazole-trimethoprim (BACTRIM DS,SEPTRA DS) 800-160 MG tablet; Take 1 tablet by mouth 2 (two) times daily.  Dispense: 84 tablet; Refill: 0  2. Burning with urination  - POCT urinalysis dipstick - Urine Culture  3. Enlarged prostate on rectal examination  - Ambulatory referral to Urology - sulfamethoxazole-trimethoprim (BACTRIM DS,SEPTRA DS) 800-160 MG tablet; Take 1 tablet by mouth 2 (two) times daily.  Dispense: 84 tablet; Refill: 0  4. Body aches  - sulfamethoxazole-trimethoprim (BACTRIM DS,SEPTRA DS) 800-160 MG tablet; Take 1 tablet by mouth 2 (two) times daily.  Dispense: 84 tablet; Refill: 0  5. Fatigue, unspecified type  - sulfamethoxazole-trimethoprim (BACTRIM DS,SEPTRA DS) 800-160 MG tablet; Take 1 tablet by mouth 2 (two) times daily.  Dispense: 84 tablet; Refill: 0  6. Need for shingles vaccine - Varicella-zoster vaccine IM (Shingrix)  7. Need for influenza vaccination - Flu Vaccine QUAD 6+ mos PF IM (Fluarix Quad PF)  Patient has UTI with systemic symptoms and enlarged prostate- high suspicion for prostatitis despite only mild discomfort from DRE- will refer to urology and plan for close follow-up. Will order PSA and other necessary labs at follow-up appointment. Discussed with Dr. Carlynn Purl who was aggreable to plan.

## 2018-04-09 ENCOUNTER — Ambulatory Visit: Payer: BLUE CROSS/BLUE SHIELD | Admitting: Urology

## 2018-04-09 ENCOUNTER — Encounter: Payer: Self-pay | Admitting: Urology

## 2018-04-09 VITALS — BP 144/96 | HR 71 | Ht 73.0 in | Wt 167.2 lb

## 2018-04-09 DIAGNOSIS — N3000 Acute cystitis without hematuria: Secondary | ICD-10-CM

## 2018-04-09 DIAGNOSIS — N410 Acute prostatitis: Secondary | ICD-10-CM

## 2018-04-09 LAB — MICROSCOPIC EXAMINATION
Bacteria, UA: NONE SEEN
EPITHELIAL CELLS (NON RENAL): NONE SEEN /HPF (ref 0–10)

## 2018-04-09 LAB — URINALYSIS, COMPLETE
Bilirubin, UA: NEGATIVE
GLUCOSE, UA: NEGATIVE
KETONES UA: NEGATIVE
Nitrite, UA: NEGATIVE
Protein, UA: NEGATIVE
SPEC GRAV UA: 1.015 (ref 1.005–1.030)
Urobilinogen, Ur: 0.2 mg/dL (ref 0.2–1.0)
pH, UA: 7 (ref 5.0–7.5)

## 2018-04-09 LAB — BLADDER SCAN AMB NON-IMAGING: Scan Result: 60

## 2018-04-09 NOTE — Progress Notes (Signed)
04/09/2018 9:43 AM   Aaron Ross 10-09-1955 [id number]  Referring provider: Alba Cory, MD [address] [place], [place] [address]  Chief Complaint  Patient presents with  . Elevated PSA    HPI: 62 year old male who presents today for evaluation of UTI/possible prostatitis.  He is seen and evaluated just yesterday at his primary care office for fatigue, whole body aches/joint pain which started on Saturday.  The following day, Sunday he developed mild amount of dysuria not associated with hematuria which was relatively mild.  It is not associated with urgency or frequency.  He does feel chilled but has been afebrile.   On exam yesterday, he was noted to have an enlarged prostate which was mildly tender.  This was concerning for presumed prostatitis.    No alleviating or exacerbating symptoms..  He started yesterday on Bactrim after notably having leukocytes, blood, and nitritie positive on urinalysis.  His culture is pending.  He was prescribed 84 tablets of Bactrim which he questions.    Today, his dysuria has resolved.  He has no ongoing urinary complaints.    No personal history of UTIs or prostatitis.  He did have one episode of dysuria in the remote past which resolved spontaneously.  Denies penile discharge.  IPSS    Row Name 04/09/18 0900         International Prostate Symptom Score   How often have you had the sensation of not emptying your bladder?  Not at All     How often have you had to urinate less than every two hours?  Less than half the time     How often have you found you stopped and started again several times when you urinated?  Not at All     How often have you found it difficult to postpone urination?  Less than 1 in 5 times     How often have you had a weak urinary stream?  Not at All     How often have you had to strain to start urination?  Less than half the time     How many times did you typically get up at night to urinate?  1  Time     Total IPSS Score  6       Quality of Life due to urinary symptoms   If you were to spend the rest of your life with your urinary condition just the way it is now how would you feel about that?  Terrible        Score:  1-7 Mild 8-19 Moderate 20-35 Severe   PMH: Past Medical History:  Diagnosis Date  . GERD (gastroesophageal reflux disease)   . Hypertension     Surgical History: Past Surgical History:  Procedure Laterality Date  . COSMETIC SURGERY    . HERNIA REPAIR      Home Medications:  Allergies as of 04/09/2018   No Known Allergies     Medication List        Accurate as of 04/09/18  9:43 AM. Always use your most recent med list.          losartan 100 MG tablet Commonly known as:  COZAAR Take 1 tablet (100 mg total) by mouth daily.   pantoprazole 40 MG tablet Commonly known as:  PROTONIX Take 1 tablet (40 mg total) by mouth daily.   rosuvastatin 5 MG tablet Commonly known as:  CRESTOR Take 1 tablet (5 mg total) by mouth daily.  sildenafil 100 MG tablet Commonly known as:  VIAGRA Take 1 tablet (100 mg total) by mouth daily as needed for erectile dysfunction.   sulfamethoxazole-trimethoprim 800-160 MG tablet Commonly known as:  BACTRIM DS,SEPTRA DS Take 1 tablet by mouth 2 (two) times daily.       Allergies: No Known Allergies  Family History: Family History  Problem Relation Age of Onset  . Hypertension Father   . Prostate cancer Neg Hx   . Bladder Cancer Neg Hx   . Kidney cancer Neg Hx     Social History:  reports that he quit smoking about 8 months ago. His smoking use included cigarettes. He has a 86.00 pack-year smoking history. He has never used smokeless tobacco. He reports that he drinks about 8.0 standard drinks of alcohol per week. He reports that he does not use drugs.  ROS: UROLOGY Frequent Urination?: Yes Hard to postpone urination?: No Burning/pain with urination?: Yes Get up at night to urinate?: Yes Leakage  of urine?: No Urine stream starts and stops?: No Trouble starting stream?: Yes Do you have to strain to urinate?: No Blood in urine?: No Urinary tract infection?: Yes Sexually transmitted disease?: No Injury to kidneys or bladder?: No Painful intercourse?: No Weak stream?: No Erection problems?: No Penile pain?: No  Gastrointestinal Nausea?: No Vomiting?: No Indigestion/heartburn?: No Diarrhea?: No Constipation?: No  Constitutional Fever: No Night sweats?: Yes Weight loss?: No Fatigue?: No  Skin Skin rash/lesions?: No Itching?: No  Eyes Blurred vision?: No Double vision?: No  Ears/Nose/Throat Sore throat?: No Sinus problems?: No  Hematologic/Lymphatic Swollen glands?: No Easy bruising?: No  Cardiovascular Leg swelling?: Yes Chest pain?: No  Respiratory Cough?: Yes Shortness of breath?: No  Endocrine Excessive thirst?: Yes  Musculoskeletal Back pain?: Yes Joint pain?: Yes  Neurological Headaches?: No Dizziness?: Yes  Psychologic Depression?: No Anxiety?: No  Physical Exam: BP (!) 144/96 (BP Location: Left Arm, Patient Position: Sitting, Cuff Size: Normal)   Pulse 71   Ht 6\' 1"  (1.854 m)   Wt 167 lb 3.2 oz (75.8 kg)   BMI 22.06 kg/m   Constitutional:  Alert and oriented, No acute distress. HEENT: Halfway AT, moist mucus membranes.  Trachea midline, no masses. Cardiovascular: No clubbing, cyanosis, or edema. Respiratory: Normal respiratory effort, no increased work of breathing. GI: Abdomen is soft, nontender, nondistended, no abdominal masses Rectal exam today was deferred as it was just performed yesterday Skin: No rashes, bruises or suspicious lesions. Neurologic: Grossly intact, no focal deficits, moving all 4 extremities. Psychiatric: Normal mood and affect.  Laboratory Data: Lab Results  Component Value Date   WBC 8.8 10/23/2017   HGB 15.0 10/23/2017   HCT 43.5 10/23/2017   MCV 89.1 10/23/2017   PLT 292 10/23/2017    Lab  Results  Component Value Date   CREATININE 1.04 10/23/2017    Lab Results  Component Value Date   HGBA1C 5.6 10/23/2017    Urinalysis    Component Value Date/Time   BILIRUBINUR neg 04/08/2018 1149   PROTEINUR Negative 04/08/2018 1149   UROBILINOGEN 0.2 04/08/2018 1149   NITRITE positive 04/08/2018 1149   LEUKOCYTESUR Moderate (2+) (A) 04/08/2018 1149   UA repeated today, appears to be improved with only some residual microscopic blood.  Pertinent Imaging: Results for orders placed or performed in visit on 04/09/18  Bladder Scan (Post Void Residual) in office  Result Value Ref Range   Scan Result 60     Assessment & Plan:    1. Acute cystitis  without hematuria UA yesterday highly suspicious for UTI and he was placed on antibiotics with improvement of the symptoms as well as urinalysis today  Adequate bladder emptying today was otherwise normotensive and nontoxic-appearing which is reasssuring  We discussed that its somewhat premature for additional urologic evaluation at this point in time thus was advised to return if his symptoms fail to resolve after 1 week of antibiotics or recur  We also discussed today that 1 week of antibiotics is likely adequate especially given his lack of fevers, chills, or concerns for severe systemic infection  - Urinalysis, Complete - Bladder Scan (Post Void Residual) in office  2. Acute prostatitis Rectal exam deferred as it was performed yesterday and noted to be mildly tender   F/u as needed  Vanna Scotland, MD  [facility] [address] [place], [place] [address] [phone]

## 2018-04-10 LAB — URINE CULTURE
MICRO NUMBER: 91238278
SPECIMEN QUALITY: ADEQUATE

## 2018-04-11 ENCOUNTER — Encounter: Payer: Self-pay | Admitting: Nurse Practitioner

## 2018-04-11 ENCOUNTER — Ambulatory Visit: Payer: BLUE CROSS/BLUE SHIELD | Admitting: Nurse Practitioner

## 2018-04-11 VITALS — BP 120/80 | HR 65 | Temp 97.9°F | Resp 16 | Ht 73.0 in | Wt 166.8 lb

## 2018-04-11 DIAGNOSIS — R82998 Other abnormal findings in urine: Secondary | ICD-10-CM

## 2018-04-11 DIAGNOSIS — M25531 Pain in right wrist: Secondary | ICD-10-CM

## 2018-04-11 DIAGNOSIS — M25532 Pain in left wrist: Secondary | ICD-10-CM

## 2018-04-11 LAB — POCT URINALYSIS DIPSTICK
APPEARANCE: NORMAL
Bilirubin, UA: NEGATIVE
GLUCOSE UA: NEGATIVE
Ketones, UA: NEGATIVE
LEUKOCYTES UA: NEGATIVE
Nitrite, UA: NEGATIVE
ODOR: NORMAL
Protein, UA: NEGATIVE
Spec Grav, UA: 1.025 (ref 1.010–1.025)
Urobilinogen, UA: 0.2 E.U./dL
pH, UA: 6 (ref 5.0–8.0)

## 2018-04-11 MED ORDER — DICLOFENAC SODIUM 1 % TD GEL: 2.0000 g | Freq: Two times a day (BID) | TRANSDERMAL | 0 refills | Status: DC | PRN

## 2018-04-11 NOTE — Patient Instructions (Addendum)
-   recommend taking daily probiotic for up to one week after complete antibiotic to restore good gut health - Take antibiotic for one week  - Drink plenty of water (64 ounces)  -  Can use Voltaren gel on wrists and painful joints as needed.

## 2018-04-11 NOTE — Progress Notes (Signed)
Name: Aaron Ross   MRN: [id number]    DOB: [birth year]   Date:04/11/2018       Progress Note  Subjective  Chief Complaint  Chief Complaint  Patient presents with  . Urinary Tract Infection    3 day follow up    HPI  Patient her for follow-up of dysuria, + leukocytes in urine, enlarged prostate, mildly tender and having fatigue, muscle aches and episode of fever considered cystitis vs prostatitis and started on bactrim; saw urology the following day and was much improved after 2 doses of antibiotics- urology recommended one week of antibiotics and return to office if symptomatic afterwards. Today feels much improved, denies dysuria.   Patient endorses bilateral wrist pain ongoing for awhile now, worse in left wrist- states arthritis, worse with push ups. Uses a brace occasionally in the past.   Patient Active Problem List   Diagnosis Date Noted  . Atherosclerosis of aorta (HCC) 10/23/2017  . History of pneumothorax 10/11/2016  . Hyperlipidemia 11/29/2015  . GERD (gastroesophageal reflux disease) 11/29/2015  . HTN (hypertension) 01/11/2015  . COPD (chronic obstructive pulmonary disease) (HCC) 01/11/2015  . Erectile dysfunction 01/11/2015  . Hyperglycemia 01/11/2015    Past Medical History:  Diagnosis Date  . GERD (gastroesophageal reflux disease)   . Hypertension     Past Surgical History:  Procedure Laterality Date  . COSMETIC SURGERY    . HERNIA REPAIR      Social History   Tobacco Use  . Smoking status: Former Smoker    Packs/day: 2.00    Years: 43.00    Pack years: 86.00    Types: Cigarettes    Last attempt to quit: 07/15/2017    Years since quitting: 0.7  . Smokeless tobacco: Never Used  Substance Use Topics  . Alcohol use: Yes    Alcohol/week: 8.0 standard drinks    Types: 8 Standard drinks or equivalent per week     Current Outpatient Medications:  .  losartan (COZAAR) 100 MG tablet, Take 1 tablet (100 mg total) by mouth daily., Disp: 90 tablet, Rfl:  1 .  pantoprazole (PROTONIX) 40 MG tablet, Take 1 tablet (40 mg total) by mouth daily., Disp: 90 tablet, Rfl: 1 .  rosuvastatin (CRESTOR) 5 MG tablet, Take 1 tablet (5 mg total) by mouth daily., Disp: 90 tablet, Rfl: 0 .  sildenafil (VIAGRA) 100 MG tablet, Take 1 tablet (100 mg total) by mouth daily as needed for erectile dysfunction., Disp: 5 tablet, Rfl: 11 .  sulfamethoxazole-trimethoprim (BACTRIM DS,SEPTRA DS) 800-160 MG tablet, Take 1 tablet by mouth 2 (two) times daily., Disp: 84 tablet, Rfl: 0  No Known Allergies  ROS  No other specific complaints in a complete review of systems (except as listed in HPI above).  Objective  Vitals:   04/11/18 1328  BP: 120/80  Pulse: 65  Resp: 16  Temp: 97.9 F (36.6 C)  TempSrc: Oral  SpO2: 97%  Weight: 166 lb 12.8 oz (75.7 kg)  Height: 6\' 1"  (1.854 m)    Body mass index is 22.01 kg/m.  Nursing Note and Vital Signs reviewed.  Physical Exam  Constitutional: He is oriented to person, place, and time. He appears well-developed and well-nourished.  HENT:  Head: Normocephalic and atraumatic.  Cardiovascular: Normal rate and intact distal pulses.  Pulmonary/Chest: Effort normal.  Abdominal:  No CVA tenderness   Musculoskeletal:       Right wrist: Normal.       Left wrist: Normal.  Neurological: [name]  and oriented to person, place, and time. Coordination normal.  Skin: Skin is warm and dry. He is not diaphoretic. No erythema.  Psychiatric: He has a normal mood and affect. His behavior is normal. Judgment and thought content normal.       No results found for this or any previous visit (from the past 48 hour(s)).  Assessment & Plan  1. Leukocytes in urine Continue antibiotics for a complete 7 day from starting, drink plenty of water; follow up with urology  - POCT Urinalysis Dipstick   3. Pain in both wrists - diclofenac sodium (VOLTAREN) 1 % GEL; Apply 2 g topically 2 (two) times daily as needed.  Dispense: 1 Tube;  Refill: 0

## 2018-04-12 ENCOUNTER — Other Ambulatory Visit: Payer: Self-pay | Admitting: Family Medicine

## 2018-04-12 DIAGNOSIS — E7800 Pure hypercholesterolemia, unspecified: Secondary | ICD-10-CM

## 2018-04-12 DIAGNOSIS — I10 Essential (primary) hypertension: Secondary | ICD-10-CM

## 2018-04-21 ENCOUNTER — Telehealth: Payer: Self-pay

## 2018-04-21 NOTE — Telephone Encounter (Signed)
Called patient. He reports that he is feeling better and he will not be needing the medication after all.

## 2018-04-21 NOTE — Telephone Encounter (Signed)
The request for coverage of Diclofenac 1% gel is denied.  This medication is approved when the member has tried an oral anti-inflammatory medication such as (ibuprofen, naproxen, meloxicam or celecoxib) that has not worked or is not tolerated.  Medication is covered when not used with an oral anti-inflammatory at the same time.   In this case the member is using this medication with an oral anti-inflammatory medication at the same time and an oral anti-inflammatory  Medication has not been tried.

## 2018-04-21 NOTE — Telephone Encounter (Signed)
Please inform patient of coverage issue- he is still welcome to buy it with prescription checking [email or web address] for coupons, If recommend taking 200-600mg  ever 8 hours of ibuprofen sparingly as needed for joint pains, ice and heat can also be helpful. Please take this medicine with food

## 2018-04-29 ENCOUNTER — Ambulatory Visit (INDEPENDENT_AMBULATORY_CARE_PROVIDER_SITE_OTHER): Payer: BLUE CROSS/BLUE SHIELD | Admitting: Family Medicine

## 2018-04-29 ENCOUNTER — Encounter: Payer: Self-pay | Admitting: Family Medicine

## 2018-04-29 VITALS — BP 118/82 | HR 65 | Temp 97.8°F | Resp 16 | Ht 73.0 in | Wt 167.8 lb

## 2018-04-29 DIAGNOSIS — J431 Panlobular emphysema: Secondary | ICD-10-CM

## 2018-04-29 DIAGNOSIS — Z0000 Encounter for general adult medical examination without abnormal findings: Secondary | ICD-10-CM | POA: Diagnosis not present

## 2018-04-29 DIAGNOSIS — K219 Gastro-esophageal reflux disease without esophagitis: Secondary | ICD-10-CM

## 2018-04-29 DIAGNOSIS — E7800 Pure hypercholesterolemia, unspecified: Secondary | ICD-10-CM | POA: Diagnosis not present

## 2018-04-29 DIAGNOSIS — I10 Essential (primary) hypertension: Secondary | ICD-10-CM

## 2018-04-29 DIAGNOSIS — I700 Atherosclerosis of aorta: Secondary | ICD-10-CM

## 2018-04-29 DIAGNOSIS — M2550 Pain in unspecified joint: Secondary | ICD-10-CM | POA: Diagnosis not present

## 2018-04-29 DIAGNOSIS — Z23 Encounter for immunization: Secondary | ICD-10-CM

## 2018-04-29 MED ORDER — ASPIRIN EC 81 MG PO TBEC: 81.0000 mg | DELAYED_RELEASE_TABLET | Freq: Every day | ORAL | 0 refills | Status: DC

## 2018-04-29 MED ORDER — PANTOPRAZOLE SODIUM 40 MG PO TBEC: 40.0000 mg | DELAYED_RELEASE_TABLET | Freq: Every day | ORAL | 1 refills | Status: DC

## 2018-04-29 MED ORDER — ROSUVASTATIN CALCIUM 5 MG PO TABS: 5.0000 mg | ORAL_TABLET | Freq: Every day | ORAL | 1 refills | Status: DC

## 2018-04-29 MED ORDER — LOSARTAN POTASSIUM 100 MG PO TABS: 100.0000 mg | ORAL_TABLET | Freq: Every day | ORAL | 1 refills | Status: DC

## 2018-04-29 NOTE — Progress Notes (Signed)
Name: Aaron Ross   MRN: [id number]    DOB: [birth year]   Date:04/29/2018       Progress Note  Subjective  Chief Complaint  Chief Complaint  Patient presents with  . Annual Exam  . Follow-up    HPI  Patient presents for annual CPE and follow up  HTN: he takes medication as prescribed and denies side effects, no chest pain or palpitation. BP today is at goal   Hyperlipidemia: taking crestor and denies side effects of medications. Last LDL was at goal.   ED: he is buying Viagra online and is doing well with 50 to 100 mg every other day.   GERD: he has been taking pantoprazole for a long time , discussed side effects of medication including colitis, heart disease or osteoporosis. He states symptoms controlled with medication   Hyperglycemia: he denies polyphagia, polyuria or polydipsia , last hgbA1C was 5.6%  Emphysema: long history of tobacco use and emphysematous change on CXR done 10/11/2017. He quit smoking 06/2017, he denies wheezing, or cough,  SOB only with strenuous activity. He states he bought a CPAP machine online and is breathing better, we will get home health to check oxygen at night . Pressure of 8 cmH20. Never had a sleep study, explained not safe  Diet: he is eating more fruit and vegetables because girlfriend is cooking now, usually frozen food  Exercise: he use to lift weights, but over the past 6 months he has noticed joint pains that migrates from shoulders, to knees, to elbows to wrist, no fever or chills and has only been walking his dogs lately   Depression:  Depression screen [facility] 2/9 04/29/2018 04/08/2018 07/22/2017 04/18/2017 02/27/2017  Decreased Interest 0 0 0 0 0  Down, Depressed, Hopeless 0 0 0 0 0  PHQ - 2 Score 0 0 0 0 0  Altered sleeping 0 0 - - -  Tired, decreased energy 0 0 - - -  Change in appetite 0 0 - - -  Feeling bad or failure about yourself  0 0 - - -  Trouble concentrating 0 0 - - -  Moving slowly or fidgety/restless 0 0 - - -  Suicidal  thoughts 0 0 - - -  PHQ-9 Score 0 0 - - -  Difficult doing work/chores Not difficult at all Not difficult at all - - -    Hypertension:  BP Readings from Last 3 Encounters:  04/29/18 118/82  04/11/18 120/80  04/09/18 (!) 144/96    Obesity: Wt Readings from Last 3 Encounters:  04/29/18 167 lb 12.8 oz (76.1 kg)  04/11/18 166 lb 12.8 oz (75.7 kg)  04/09/18 167 lb 3.2 oz (75.8 kg)   BMI Readings from Last 3 Encounters:  04/29/18 22.14 kg/m  04/11/18 22.01 kg/m  04/09/18 22.06 kg/m     Lipids:  Lab Results  Component Value Date   CHOL 161 10/23/2017   CHOL 211 (H) 01/17/2017   CHOL 205 (H) 10/25/2015   Lab Results  Component Value Date   HDL 64 10/23/2017   HDL 62 01/17/2017   HDL 74 10/25/2015   Lab Results  Component Value Date   LDLCALC 72 10/23/2017   LDLCALC 128 (H) 01/17/2017   LDLCALC 113 (H) 10/25/2015   Lab Results  Component Value Date   TRIG 169 (H) 10/23/2017   TRIG 106 01/17/2017   TRIG 92 10/25/2015   Lab Results  Component Value Date   CHOLHDL 2.5 10/23/2017   CHOLHDL 3.4 01/17/2017  CHOLHDL 2.8 10/25/2015   No results found for: LDLDIRECT Glucose:  Glucose  Date Value Ref Range Status  10/25/2015 100 (H) 65 - 99 mg/dL Final   Glucose, Bld  Date Value Ref Range Status  10/23/2017 96 65 - 139 mg/dL Final    Comment:    .        Non-fasting reference interval .   10/11/2016 117 (H) 65 - 99 mg/dL Final      [facility] Visit from 04/29/2018 in [facility]  AUDIT-C Score  1      Divorced STD testing and prevention (HIV/chl/gon/syphilis): same partner Hep C: up to date   Skin cancer: discussed atypical lesions  Colorectal cancer: cologuard is up to date Prostate cancer: no family history of prostate cancer  IPSS Questionnaire (AUA-7): Over the past month.   1)  How often have you had a sensation of not emptying your bladder completely after you finish urinating?  0 - Not at all  2)  How often have you had  to urinate again less than two hours after you finished urinating? 0 - Not at all  3)  How often have you found you stopped and started again several times when you urinated?  0 - Not at all  4) How difficult have you found it to postpone urination?  0 - Not at all  5) How often have you had a weak urinary stream?  0 - Not at all  6) How often have you had to push or strain to begin urination?  0 - Not at all  7) How many times did you most typically get up to urinate from the time you went to bed until the time you got up in the morning?  1 - 1 time  Total score:  0-7 mildly symptomatic   8-19 moderately symptomatic   20-35 severely symptomatic    Lung cancer:  Low Dose CT Chest recommended if Age 52-80 years, 30 pack-year currently smoking OR have quit w/in 15years. Patient does qualify.   ECG:  Up to date   Advanced Care Planning: A voluntary discussion about advance care planning including the explanation and discussion of advance directives.  Discussed health care proxy and Living will, and the patient was able to identify a health care proxy as ex-wife, but he will change it .  Patient does have a living will at present time. If patient does have living will, I have requested they bring this to the clinic to be scanned in to their chart.  Patient Active Problem List   Diagnosis Date Noted  . Atherosclerosis of aorta (HCC) 10/23/2017  . History of pneumothorax 10/11/2016  . Hyperlipidemia 11/29/2015  . GERD (gastroesophageal reflux disease) 11/29/2015  . HTN (hypertension) 01/11/2015  . COPD (chronic obstructive pulmonary disease) (HCC) 01/11/2015  . Erectile dysfunction 01/11/2015  . Hyperglycemia 01/11/2015    Past Surgical History:  Procedure Laterality Date  . COSMETIC SURGERY    . HERNIA REPAIR      Family History  Problem Relation Age of Onset  . Hypertension Father   . Prostate cancer Neg Hx   . Bladder Cancer Neg Hx   . Kidney cancer Neg Hx     Social History    Socioeconomic History  . Marital status: Divorced    Spouse name: Not on file  . Number of children: 0  . Years of education: Not on file  . Highest education level: 9th grade  Occupational History  .  Occupation: Customer service manager   Social Needs  . Financial resource strain: Not hard at all  . Food insecurity:    Worry: Never true    Inability: Never true  . Transportation needs:    Medical: No    Non-medical: No  Tobacco Use  . Smoking status: Former Smoker    Packs/day: 2.00    Years: 43.00    Pack years: 86.00    Types: Cigarettes    Last attempt to quit: 07/15/2017    Years since quitting: 0.7  . Smokeless tobacco: Never Used  Substance and Sexual Activity  . Alcohol use: Yes    Alcohol/week: 8.0 standard drinks    Types: 8 Standard drinks or equivalent per week  . Drug use: No  . Sexual activity: Yes    Partners: Female    Birth control/protection: Surgical  Lifestyle  . Physical activity:    Days per week: 7 days    Minutes per session: 20 min  . Stress: Not at all  Relationships  . Social connections:    Talks on phone: Never    Gets together: Never    Attends religious service: 1 to 4 times per year    Active member of club or organization: No    Attends meetings of clubs or organizations: Never    Relationship status: Divorced  . Intimate partner violence:    Fear of current or ex partner: No    Emotionally abused: No    Physically abused: No    Forced sexual activity: No  Other Topics Concern  . Not on file  Social History Narrative   Divorced, never had children, he is now dating a lady from [place]   He works full time in [place], Press photographer   He is originally [place] Moved here with parents and siblings when he was 16.    Siblings still in [place]     Current Outpatient Medications:  .  losartan (COZAAR) 100 MG tablet, Take 1 tablet (100 mg total) by mouth daily., Disp: 90 tablet, Rfl: 1 .  pantoprazole (PROTONIX) 40 MG tablet, Take 1  tablet (40 mg total) by mouth daily., Disp: 90 tablet, Rfl: 1 .  rosuvastatin (CRESTOR) 5 MG tablet, Take 1 tablet (5 mg total) by mouth daily., Disp: 90 tablet, Rfl: 1 .  sildenafil (VIAGRA) 100 MG tablet, Take 1 tablet (100 mg total) by mouth daily as needed for erectile dysfunction., Disp: 5 tablet, Rfl: 11 .  aspirin EC 81 MG tablet, Take 1 tablet (81 mg total) by mouth daily., Disp: 30 tablet, Rfl: 0  No Known Allergies   ROS  Constitutional: Negative for fever or weight change.  Respiratory: Negative for cough but has some  shortness of breath with activity .   Cardiovascular: Negative for chest pain or palpitations.  Gastrointestinal: Negative for abdominal pain, no bowel changes.  Musculoskeletal: Negative for gait problem or joint swelling. He has noticed arthralgias Skin: Negative for rash.  Neurological: Negative for dizziness or headache.  No other specific complaints in a complete review of systems (except as listed in HPI above).  Objective  Vitals:   04/29/18 0802  BP: 118/82  Pulse: 65  Resp: 16  Temp: 97.8 F (36.6 C)  TempSrc: Oral  SpO2: 97%  Weight: 167 lb 12.8 oz (76.1 kg)  Height: 6\' 1"  (1.854 m)    Body mass index is 22.14 kg/m.  Physical Exam  Constitutional: Patient appears well-developed and well-nourished. No distress.  HENT: Head:  Normocephalic and atraumatic. Ears: B TMs ok, no erythema or effusion; Nose: Nose normal. Mouth/Throat: Oropharynx is clear and moist. No oropharyngeal exudate.  Eyes: Conjunctivae and EOM are normal. Pupils are equal, round, and reactive to light. No scleral icterus.  Neck: Normal range of motion. Neck supple. No JVD present. No thyromegaly present.  Cardiovascular: Normal rate, regular rhythm and normal heart sounds.  No murmur heard. No BLE edema. Pulmonary/Chest: Effort normal and breath sounds normal. No respiratory distress. Abdominal: Soft. Bowel sounds are normal, no distension. There is no tenderness. no  masses MALE GENITALIA: Normal descended testes bilaterally, no masses palpated, history of hernia repair on left side - weaker than right side , no lesions, no discharge RECTAL: not done Musculoskeletal: Normal range of motion, no joint effusions. No gross deformities Neurological: he is alert and oriented to person, place, and time. No cranial nerve deficit. Coordination, balance, strength, speech and gait are normal.  Skin: Skin is warm and dry. No rash noted. No erythema.  Psychiatric: Patient has a normal mood and affect. behavior is normal. Judgment and thought content normal.  Recent Results (from the past 2160 hour(s))  POCT urinalysis dipstick     Status: Abnormal   Collection Time: 04/08/18 11:49 AM  Result Value Ref Range   Color, UA drk yellow    Clarity, UA cloudy    Glucose, UA Negative Negative   Bilirubin, UA neg    Ketones, UA neg    Spec Grav, UA 1.020 1.010 - 1.025   Blood, UA 3+    pH, UA 6.5 5.0 - 8.0   Protein, UA Negative Negative   Urobilinogen, UA 0.2 0.2 or 1.0 E.U./dL   Nitrite, UA positive    Leukocytes, UA Moderate (2+) (A) Negative   Appearance cloudy    Odor normal   Urine Culture     Status: Abnormal   Collection Time: 04/08/18 12:22 PM  Result Value Ref Range   MICRO NUMBER: [id number]    SPECIMEN QUALITY: ADEQUATE    Sample Source URINE    STATUS: FINAL    ISOLATE 1: Escherichia coli (A)     Comment: Greater than 100,000 CFU/mL of Escherichia coli      Susceptibility   Escherichia coli - URINE CULTURE, REFLEX    AMOX/CLAVULANIC <=2 Sensitive     AMPICILLIN 4 Sensitive     AMPICILLIN/SULBACTAM <=2 Sensitive     CEFAZOLIN* <=4 Not Reportable      * For infections other than uncomplicated UTIcaused by E. coli, K. pneumoniae or P. mirabilis:Cefazolin is resistant if MIC > or = 8 mcg/mL.(Distinguishing susceptible versus intermediatefor isolates with MIC < or = 4 mcg/mL requiresadditional testing.)For uncomplicated UTI caused by E. coli,K. pneumoniae  or P. mirabilis: Cefazolin issusceptible if MIC <32 mcg/mL and predictssusceptible to the oral agents cefaclor, cefdinir,cefpodoxime, cefprozil, cefuroxime, cephalexinand loracarbef.    CEFEPIME <=1 Sensitive     CEFTRIAXONE <=1 Sensitive     CIPROFLOXACIN <=0.25 Sensitive     LEVOFLOXACIN <=0.12 Sensitive     ERTAPENEM <=0.5 Sensitive     GENTAMICIN <=1 Sensitive     IMIPENEM <=0.25 Sensitive     NITROFURANTOIN <=16 Sensitive     PIP/TAZO <=4 Sensitive     TOBRAMYCIN <=1 Sensitive     TRIMETH/SULFA* <=20 Sensitive      * For infections other than uncomplicated UTIcaused by E. coli, K. pneumoniae or P. mirabilis:Cefazolin is resistant if MIC > or = 8 mcg/mL.(Distinguishing susceptible versus intermediatefor isolates with MIC <  or = 4 mcg/mL requiresadditional testing.)For uncomplicated UTI caused by E. coli,K. pneumoniae or P. mirabilis: Cefazolin issusceptible if MIC <32 mcg/mL and predictssusceptible to the oral agents cefaclor, cefdinir,cefpodoxime, cefprozil, cefuroxime, cephalexinand loracarbef.Legend:S = Susceptible  I = IntermediateR = Resistant  NS = Not susceptible* = Not tested  NR = Not reported**NN = See antimicrobic comments  Bladder Scan (Post Void Residual) in office     Status: None   Collection Time: 04/09/18  9:37 AM  Result Value Ref Range   Scan Result 60   Urinalysis, Complete     Status: Abnormal   Collection Time: 04/09/18  9:43 AM  Result Value Ref Range   Specific Gravity, UA 1.015 1.005 - 1.030   pH, UA 7.0 5.0 - 7.5   Color, UA Yellow Yellow   Appearance Ur Clear Clear   Leukocytes, UA Trace (A) Negative   Protein, UA Negative Negative/Trace   Glucose, UA Negative Negative   Ketones, UA Negative Negative   RBC, UA 1+ (A) Negative   Bilirubin, UA Negative Negative   Urobilinogen, Ur 0.2 0.2 - 1.0 mg/dL   Nitrite, UA Negative Negative   Microscopic Examination See below:   Microscopic Examination     Status: Abnormal   Collection Time: 04/09/18  9:43 AM   Result Value Ref Range   WBC, UA 0-5 0 - 5 /hpf   RBC, UA 3-10 (A) 0 - 2 /hpf   Epithelial Cells (non renal) None seen 0 - 10 /hpf   Bacteria, UA None seen None seen/Few  POCT Urinalysis Dipstick     Status: Abnormal   Collection Time: 04/11/18  1:36 PM  Result Value Ref Range   Color, UA Golden    Clarity, UA Clear    Glucose, UA Negative Negative   Bilirubin, UA Negative    Ketones, UA Negative    Spec Grav, UA 1.025 1.010 - 1.025   Blood, UA Moderate    pH, UA 6.0 5.0 - 8.0   Protein, UA Negative Negative   Urobilinogen, UA 0.2 0.2 or 1.0 E.U./dL   Nitrite, UA Negative    Leukocytes, UA Negative Negative   Appearance Normal    Odor Normal      PHQ2/9: Depression screen [facility] 2/9 04/29/2018 04/08/2018 07/22/2017 04/18/2017 02/27/2017  Decreased Interest 0 0 0 0 0  Down, Depressed, Hopeless 0 0 0 0 0  PHQ - 2 Score 0 0 0 0 0  Altered sleeping 0 0 - - -  Tired, decreased energy 0 0 - - -  Change in appetite 0 0 - - -  Feeling bad or failure about yourself  0 0 - - -  Trouble concentrating 0 0 - - -  Moving slowly or fidgety/restless 0 0 - - -  Suicidal thoughts 0 0 - - -  PHQ-9 Score 0 0 - - -  Difficult doing work/chores Not difficult at all Not difficult at all - - -     Fall Risk: Fall Risk  04/29/2018 04/11/2018 04/08/2018 10/23/2017 10/23/2017  Falls in the past year? 0 No No No No     Functional Status Survey: Is the patient deaf or have difficulty hearing?: No Does the patient have difficulty seeing, even when wearing glasses/contacts?: No Does the patient have difficulty concentrating, remembering, or making decisions?: No Does the patient have difficulty walking or climbing stairs?: No Does the patient have difficulty dressing or bathing?: No Does the patient have difficulty doing errands alone such as visiting  a doctor's office or shopping?: No   Assessment & Plan  1. Well adult exam   2. Essential hypertension  - losartan (COZAAR) 100 MG tablet; Take  1 tablet (100 mg total) by mouth daily.  Dispense: 90 tablet; Refill: 1  3. Gastroesophageal reflux disease, esophagitis presence not specified  - pantoprazole (PROTONIX) 40 MG tablet; Take 1 tablet (40 mg total) by mouth daily.  Dispense: 90 tablet; Refill: 1  4. Pure hypercholesterolemia  - rosuvastatin (CRESTOR) 5 MG tablet; Take 1 tablet (5 mg total) by mouth daily.  Dispense: 90 tablet; Refill: 1  5. Arthralgia, unspecified joint  - Sedimentation rate - C-reactive protein - ANA,IFA RA Diag Pnl w/rflx Tit/Patn  6. Atherosclerosis of aorta (HCC)  Discussed aspirin and continue statin therapy   7. Panlobular emphysema (HCC)  He does not want medication, discussed spirometry and check for hypoxemia and he is willing to have home evaluation at night -referral [facility]   8. Need for pneumococcal vaccination  - Pneumococcal polysaccharide vaccine 23-valent greater than or equal to 2yo subcutaneous/IM   -Prostate cancer screening and PSA options (with potential risks and benefits of testing vs not testing) were discussed along with recent recs/guidelines. -USPSTF grade A and B recommendations reviewed with patient; age-appropriate recommendations, preventive care, screening tests, etc discussed and encouraged; healthy living encouraged; see AVS for patient education given to patient -Discussed importance of 150 minutes of physical activity weekly, eat two servings of fish weekly, eat one serving of tree nuts ( cashews, pistachios, pecans, almonds.Marland Kitchen) every other day, eat 6 servings of fruit/vegetables daily and drink plenty of water and avoid sweet beverages.

## 2018-04-29 NOTE — Patient Instructions (Signed)

## 2018-05-01 LAB — ANA,IFA RA DIAG PNL W/RFLX TIT/PATN
ANA: NEGATIVE
Cyclic Citrullin Peptide Ab: <16 UNITS
Rhuematoid fact SerPl-aCnc: <14 IU/mL (ref <14)

## 2018-05-01 LAB — SEDIMENTATION RATE: Sed Rate: 2 mm/h (ref 0–20)

## 2018-05-01 LAB — C-REACTIVE PROTEIN: CRP: 1.2 mg/L (ref <8.0)

## 2018-05-07 DIAGNOSIS — D225 Melanocytic nevi of trunk: Secondary | ICD-10-CM | POA: Diagnosis not present

## 2018-05-07 DIAGNOSIS — Z1283 Encounter for screening for malignant neoplasm of skin: Secondary | ICD-10-CM | POA: Diagnosis not present

## 2018-05-07 DIAGNOSIS — L821 Other seborrheic keratosis: Secondary | ICD-10-CM | POA: Diagnosis not present

## 2018-05-07 DIAGNOSIS — D2230 Melanocytic nevi of unspecified part of face: Secondary | ICD-10-CM | POA: Diagnosis not present

## 2018-05-25 ENCOUNTER — Other Ambulatory Visit: Payer: Self-pay | Admitting: Family Medicine

## 2018-05-25 DIAGNOSIS — I700 Atherosclerosis of aorta: Secondary | ICD-10-CM

## 2018-06-16 ENCOUNTER — Encounter: Payer: Self-pay | Admitting: Family Medicine

## 2018-07-23 ENCOUNTER — Other Ambulatory Visit: Payer: Self-pay | Admitting: Nurse Practitioner

## 2018-07-23 DIAGNOSIS — R52 Pain, unspecified: Secondary | ICD-10-CM

## 2018-07-23 DIAGNOSIS — R5383 Other fatigue: Secondary | ICD-10-CM

## 2018-07-23 DIAGNOSIS — R82998 Other abnormal findings in urine: Secondary | ICD-10-CM

## 2018-07-23 DIAGNOSIS — N400 Enlarged prostate without lower urinary tract symptoms: Secondary | ICD-10-CM

## 2018-09-07 DIAGNOSIS — R05 Cough: Secondary | ICD-10-CM | POA: Diagnosis not present

## 2018-09-07 DIAGNOSIS — R070 Pain in throat: Secondary | ICD-10-CM | POA: Diagnosis not present

## 2018-09-07 DIAGNOSIS — J00 Acute nasopharyngitis [common cold]: Secondary | ICD-10-CM | POA: Diagnosis not present

## 2018-10-23 ENCOUNTER — Other Ambulatory Visit: Payer: Self-pay | Admitting: Family Medicine

## 2018-10-23 DIAGNOSIS — K219 Gastro-esophageal reflux disease without esophagitis: Secondary | ICD-10-CM

## 2018-10-23 NOTE — Telephone Encounter (Signed)
Spoke with pt and he has enough to last until his appt

## 2018-10-23 NOTE — Telephone Encounter (Signed)
Refill request for general medication: Protonix 40 mg  Last office visit: 04/29/2018  Follow-ups on file. 10/28/2018

## 2018-10-28 ENCOUNTER — Other Ambulatory Visit: Payer: Self-pay

## 2018-10-28 ENCOUNTER — Encounter: Payer: Self-pay | Admitting: Family Medicine

## 2018-10-28 ENCOUNTER — Ambulatory Visit: Payer: BLUE CROSS/BLUE SHIELD | Admitting: Family Medicine

## 2018-10-28 VITALS — BP 118/64 | HR 66 | Temp 98.1°F | Resp 16 | Ht 73.0 in | Wt 171.3 lb

## 2018-10-28 DIAGNOSIS — E7800 Pure hypercholesterolemia, unspecified: Secondary | ICD-10-CM | POA: Diagnosis not present

## 2018-10-28 DIAGNOSIS — I700 Atherosclerosis of aorta: Secondary | ICD-10-CM

## 2018-10-28 DIAGNOSIS — N528 Other male erectile dysfunction: Secondary | ICD-10-CM

## 2018-10-28 DIAGNOSIS — R739 Hyperglycemia, unspecified: Secondary | ICD-10-CM

## 2018-10-28 DIAGNOSIS — K219 Gastro-esophageal reflux disease without esophagitis: Secondary | ICD-10-CM

## 2018-10-28 DIAGNOSIS — J431 Panlobular emphysema: Secondary | ICD-10-CM

## 2018-10-28 DIAGNOSIS — I10 Essential (primary) hypertension: Secondary | ICD-10-CM

## 2018-10-28 MED ORDER — ROSUVASTATIN CALCIUM 5 MG PO TABS: 5.0000 mg | ORAL_TABLET | Freq: Every day | ORAL | 1 refills | Status: DC

## 2018-10-28 MED ORDER — LOSARTAN POTASSIUM 100 MG PO TABS: 100.0000 mg | ORAL_TABLET | Freq: Every day | ORAL | 1 refills | Status: DC

## 2018-10-28 MED ORDER — ALBUTEROL SULFATE (2.5 MG/3ML) 0.083% IN NEBU
2.5000 mg | INHALATION_SOLUTION | Freq: Once | RESPIRATORY_TRACT | Status: AC
  Administered 2018-10-28: 2.5 mg via RESPIRATORY_TRACT

## 2018-10-28 MED ORDER — PANTOPRAZOLE SODIUM 40 MG PO TBEC: 40.0000 mg | DELAYED_RELEASE_TABLET | Freq: Every day | ORAL | 1 refills | Status: DC

## 2018-10-28 MED ORDER — SILDENAFIL CITRATE 100 MG PO TABS: 100.0000 mg | ORAL_TABLET | Freq: Every day | ORAL | 0 refills | Status: DC | PRN

## 2018-10-28 NOTE — Progress Notes (Signed)
Name: Aaron Ross   MRN: [id number]    DOB: 1955-07-15   Date:10/28/2018       Progress Note  Subjective  Chief Complaint  No chief complaint on file.   HPI  HTN: he takes medication as prescribed and denies side effects, no chest pain or palpitation. BP has been controlled. Very seldom gets dizzy only when he over-exercises - over 20 minutes of high intensity exercises, he states causes SOB. Not associated with chest pain, just feels likes he needs more air, and goes away within a few seconds of stopping the activity   Atherosclerosis of Aorta: found on CXR done 10/13/2016, he is on statin therapy and aspirin and discussed importance of keeping bp controlled   Hyperlipidemia: taking crestor and denies side effects of medications. Last LDL was 72, we will recheck labs today   ED: he is buying Viagra online and is doing well with 50 to 100 mg every other day, discussed GoodRX and he will get it from [employer] with coupon   GERD: he has been taking pantoprazole for a long time , discussed side effects of medication including colitis, heart disease or osteoporosis. He states symptoms controlled with medication, he states if he skips tow days symptoms recurs.    Hyperglycemia: he denies polyphagia, polyuria or polydipsia , last hgbA1C was 5.6%, we will recheck it today   Emphysema: long history of tobacco use and emphysematous change on CXR done 10/11/2017. He quit smoking 06/2017, he denies wheezing, or cough,  SOB only with strenuous activity.He states he bought a CPAP machine online and is breathing better, we will get home health to check oxygen at night Pressure of 8 cmH20. Never had a sleep study, explained not safe. Today spirometry showed: FEV1/FVC was 82% and after albuterol it went down   Patient Active Problem List   Diagnosis Date Noted  . Atherosclerosis of aorta (HCC) 10/23/2017  . History of pneumothorax 10/11/2016  . Hyperlipidemia 11/29/2015  . GERD  (gastroesophageal reflux disease) 11/29/2015  . HTN (hypertension) 01/11/2015  . COPD (chronic obstructive pulmonary disease) (HCC) 01/11/2015  . Erectile dysfunction 01/11/2015  . Hyperglycemia 01/11/2015    Past Surgical History:  Procedure Laterality Date  . COSMETIC SURGERY    . HERNIA REPAIR      Family History  Problem Relation Age of Onset  . Hypertension Father   . Prostate cancer Neg Hx   . Bladder Cancer Neg Hx   . Kidney cancer Neg Hx     Social History   Socioeconomic History  . Marital status: Married    Spouse name: Not on file  . Number of children: 0  . Years of education: Not on file  . Highest education level: 9th grade  Occupational History  . Occupation: Customer service manager   Social Needs  . Financial resource strain: Not hard at all  . Food insecurity:    Worry: Never true    Inability: Never true  . Transportation needs:    Medical: No    Non-medical: No  Tobacco Use  . Smoking status: Former Smoker    Packs/day: 2.00    Years: 43.00    Pack years: 86.00    Types: Cigarettes    Start date: 06/25/1972    Last attempt to quit: 07/15/2017    Years since quitting: 1.2  . Smokeless tobacco: Never Used  Substance and Sexual Activity  . Alcohol use: Yes    Alcohol/week: 8.0 standard drinks    Types:  8 Standard drinks or equivalent per week  . Drug use: No  . Sexual activity: Yes    Partners: Female    Birth control/protection: Surgical  Lifestyle  . Physical activity:    Days per week: 7 days    Minutes per session: 20 min  . Stress: Not at all  Relationships  . Social connections:    Talks on phone: Never    Gets together: Never    Attends religious service: 1 to 4 times per year    Active member of club or organization: No    Attends meetings of clubs or organizations: Never    Relationship status: Divorced  . Intimate partner violence:    Fear of current or ex partner: No    Emotionally abused: No    Physically abused: No     Forced sexual activity: No  Other Topics Concern  . Not on file  Social History Narrative   Divorced, never had children, he is now dating a lady from [place]   He works full time in [employer], Press photographer   He is originally from [place] Moved here with parents and siblings when he was 16.    Siblings still in [place]     Current Outpatient Medications:  .  aspirin 81 MG EC tablet, TAKE 1 TABLET BY MOUTH EVERY DAY, Disp: 30 tablet, Rfl: 0 .  losartan (COZAAR) 100 MG tablet, Take 1 tablet (100 mg total) by mouth daily., Disp: 90 tablet, Rfl: 1 .  pantoprazole (PROTONIX) 40 MG tablet, Take 1 tablet (40 mg total) by mouth daily., Disp: 90 tablet, Rfl: 1 .  rosuvastatin (CRESTOR) 5 MG tablet, Take 1 tablet (5 mg total) by mouth daily., Disp: 90 tablet, Rfl: 1 .  sildenafil (VIAGRA) 100 MG tablet, Take 1 tablet (100 mg total) by mouth daily as needed for erectile dysfunction., Disp: 5 tablet, Rfl: 11  No Known Allergies  I personally reviewed active problem list, medication list, allergies, family history, social history with the patient/caregiver today.   ROS  Constitutional: Negative for fever or weight change.  Respiratory: Negative for cough he has  shortness of breath only with strenuous exercise   Cardiovascular: Negative for chest pain or palpitations.  Gastrointestinal: Negative for abdominal pain, no bowel changes.  Musculoskeletal: Negative for gait problem or joint swelling.  Skin: Negative for rash.  Neurological: Negative for dizziness or headache.  No other specific complaints in a complete review of systems (except as listed in HPI above).  Objective  Vitals:   10/28/18 0819  BP: 118/64  Pulse: 66  Resp: 16  Temp: 98.1 F (36.7 C)  TempSrc: Oral  SpO2: 95%  Weight: 171 lb 4.8 oz (77.7 kg)  Height:  (1.854 m)    Body mass index is 22.6 kg/m.  Physical Exam  Constitutional: Patient appears well-developed and well-nourished. No distress.  HEENT: head  atraumatic, normocephalic, pupils equal and reactive to light,  neck supple Cardiovascular: Normal rate, regular rhythm and normal heart sounds.  No murmur heard. No BLE edema. Pulmonary/Chest: Effort normal and breath sounds normal. No respiratory distress. Abdominal: Soft.  There is no tenderness. Psychiatric: Patient has a normal mood and affect. behavior is normal. Judgment and thought content normal.  PHQ2/9: Depression screen [facility] 2/9 04/29/2018 04/08/2018 07/22/2017 04/18/2017 02/27/2017  Decreased Interest 0 0 0 0 0  Down, Depressed, Hopeless 0 0 0 0 0  PHQ - 2 Score 0 0 0 0 0  Altered sleeping 0 0 - - -  Tired, decreased energy 0 0 - - -  Change in appetite 0 0 - - -  Feeling bad or failure about yourself  0 0 - - -  Trouble concentrating 0 0 - - -  Moving slowly or fidgety/restless 0 0 - - -  Suicidal thoughts 0 0 - - -  PHQ-9 Score 0 0 - - -  Difficult doing work/chores Not difficult at all Not difficult at all - - -    phq 9 is negative   Fall Risk: Fall Risk  04/29/2018 04/11/2018 04/08/2018 10/23/2017 10/23/2017  Falls in the past year? 0 No No No No     Assessment & Plan  1. Panlobular emphysema (HCC)  - Spirometry with Graph - albuterol (PROVENTIL) (2.5 MG/3ML) 0.083% nebulizer solution 2.5 mg  2. Atherosclerosis of aorta (HCC)  Continue aspirin and statin therapy   3. Gastroesophageal reflux disease, esophagitis presence not specified  - pantoprazole (PROTONIX) 40 MG tablet; Take 1 tablet (40 mg total) by mouth daily.  Dispense: 90 tablet; Refill: 1  4. Pure hypercholesterolemia  - rosuvastatin (CRESTOR) 5 MG tablet; Take 1 tablet (5 mg total) by mouth daily.  Dispense: 90 tablet; Refill: 1 - Lipid panel  5. Other male erectile dysfunction  - sildenafil (VIAGRA) 100 MG tablet; Take 1 tablet (100 mg total) by mouth daily as needed for erectile dysfunction.  Dispense: 30 tablet; Refill: 0  6. Essential hypertension  - losartan (COZAAR) 100 MG tablet; Take  1 tablet (100 mg total) by mouth daily.  Dispense: 90 tablet; Refill: 1 - COMPLETE METABOLIC PANEL WITH GFR  7. Hyperglycemia  - Hemoglobin A1c

## 2018-10-29 LAB — HEMOGLOBIN A1C
Hgb A1c MFr Bld: 5.6 % of total Hgb (ref <5.7)
Mean Plasma Glucose: 114 (calc)
eAG (mmol/L): 6.3 (calc)

## 2018-10-29 LAB — COMPLETE METABOLIC PANEL WITH GFR
AG Ratio: 1.9 (calc) (ref 1.0–2.5)
ALT: 44 U/L (ref 9–46)
AST: 39 U/L — ABNORMAL HIGH (ref 10–35)
Albumin: 4.4 g/dL (ref 3.6–5.1)
Alkaline phosphatase (APISO): 73 U/L (ref 35–144)
BUN: 13 mg/dL (ref 7–25)
CO2: 26 mmol/L (ref 20–32)
Calcium: 9.1 mg/dL (ref 8.6–10.3)
Chloride: 104 mmol/L (ref 98–110)
Creat: 1.05 mg/dL (ref 0.70–1.25)
GFR, Est African American: 88 mL/min/{1.73_m2} (ref >=60)
GFR, Est Non African American: 76 mL/min/{1.73_m2} (ref >=60)
Globulin: 2.3 g/dL (calc) (ref 1.9–3.7)
Glucose, Bld: 112 mg/dL — ABNORMAL HIGH (ref 65–99)
Potassium: 3.8 mmol/L (ref 3.5–5.3)
Sodium: 139 mmol/L (ref 135–146)
Total Bilirubin: 0.8 mg/dL (ref 0.2–1.2)
Total Protein: 6.7 g/dL (ref 6.1–8.1)

## 2018-10-29 LAB — LIPID PANEL
Cholesterol: 165 mg/dL (ref <200)
HDL: 63 mg/dL (ref >=40)
LDL Cholesterol (Calc): 77 mg/dL (calc)
Non-HDL Cholesterol (Calc): 102 mg/dL (calc) (ref <130)
Total CHOL/HDL Ratio: 2.6 (calc) (ref <5.0)
Triglycerides: 149 mg/dL (ref <150)

## 2019-01-01 ENCOUNTER — Ambulatory Visit: Payer: Self-pay | Admitting: *Deleted

## 2019-01-01 NOTE — Telephone Encounter (Signed)
Upon receiving this call, patient immediately reports his oxygen was at 78% last night. Today, it is 82%RA with a temperature of 103.6. states he is in [place]. Advised to be driven to the nearest ED for treatment.No triage performed.  Advised to wear mask and use hand sanitizer frequently while at the hospital. Stated he understood and will seek treatment now.

## 2019-01-05 NOTE — Telephone Encounter (Signed)
Patient returned call from message left and he says that he did go to the ED in [place], they checked his lungs by x-ray and it said no pneumonia and he was released. He says he's still having fever off and on, this morning it was 101 and 98.8. He says he's been taking Tylenol 325 mg  2 tabs whenever he has a fever. He says his wife is concerned because his oxygen level is 89-90%, but this past weekend it was running 98%. He says he was covid tested, but he doesn't have results back yet. I advised I will send this to Dr. Ancil Boozer and someone will call with her recommendation.    January 02, 2019 Aaron Sizer, MD to [name], [place]      12:32 PM Called patient to make sure he went to [facility] but no answer. Please check on him later

## 2019-01-09 ENCOUNTER — Ambulatory Visit: Payer: Self-pay

## 2019-01-09 DIAGNOSIS — U071 COVID-19: Secondary | ICD-10-CM

## 2019-01-09 NOTE — Telephone Encounter (Signed)
Patient called and asked when can he go back out into society and come off isolation for testing covid positive 10 days ago. He says his appetite is back, he's able to eat 3 meals. He says he wakes up in the morning in a sweat and checks his temperature and he gets 99-99.5, but during the day, he's 97-98. He says he was having SOB with activity way before he was tested due to smoking, so he says he's not worried so much about that. He says his oxygen level at times it will be around 94-95, then it will drop 87-88, but not staying that low. He says he's not as tired as he was, he went out to walk the dog and is moving around more. I advised I will send this to Dr. Ancil Boozer and someone will call back with her recommendation, he verbalized understanding.  Reason for Disposition . [1] Caller requesting NON-URGENT health information AND [2] PCP's office is the best resource  Protocols used: INFORMATION ONLY CALL-A-AH

## 2019-01-12 ENCOUNTER — Telehealth: Payer: Self-pay | Admitting: Family Medicine

## 2019-01-12 ENCOUNTER — Ambulatory Visit: Payer: Self-pay | Admitting: *Deleted

## 2019-01-12 NOTE — Addendum Note (Signed)
Addended by: Steele Sizer F on: 01/12/2019 08:07 PM   Modules accepted: Orders

## 2019-01-12 NOTE — Telephone Encounter (Addendum)
TC to patient. Reviewed requirements in order to discontinue isolation due to his positive covid results. He has been asymptomatic for the past 5 days and is well past the 10 day isolation period (began on 7/2 per patient). He would like a statement to this effect placed in MyChart so that he may provide this information to his employer in order to return to work. Routing to PCP.  No answer. Left VM to return call to discuss.       Patient is calling for advice when he can return back to society. He is feeling better for about 5 days. The patient last had a fever was last Wednesday. Please advise CB- [phone]   Call History   Type Contact Phone User  01/12/2019 09:30 AM EDT Phone ([address]) Vishal, Sandlin (Self) [phone] Lemmie Evens) Luciana Axe

## 2019-01-12 NOTE — Telephone Encounter (Signed)
Patient states he has been fever free since July 15 and it was only a slight fever. He was in [place] went he was tested for COVID on January 02, 2019 and it came back positive on January 05, 2019. He needs a release statement to go back to work however, before he can return. He states he has felt better and has regained his sense of smell for the past 5 days.

## 2019-01-12 NOTE — Telephone Encounter (Signed)
  Reason for Disposition . [1] Follow-up call to recent contact AND [2] information only call, no triage required  Answer Assessment - Initial Assessment Questions 1. REASON FOR CALL or QUESTION: "What is your reason for calling today?" or "How can I best help you?" or "What question do you have that I can help answer?"     Requesting a statement that he is out of isolation so he may return to work.  Protocols used: INFORMATION ONLY CALL - NO TRIAGE-A-AH

## 2019-01-12 NOTE — Telephone Encounter (Unsigned)
Copied from [place] [id number]. Topic: General - Other >> Jan 12, 2019  2:43 PM Pauline Good wrote: Reason for CRM: pt need to have a negative result covid test for work since his 1st one was positive

## 2019-01-13 ENCOUNTER — Encounter: Payer: Self-pay | Admitting: Family Medicine

## 2019-01-13 NOTE — Telephone Encounter (Signed)
Left message on voicemail we have to get a negative COVID test result before Dr. Ancil Boozer is able to write a letter for him to return to work. Order has been entered and patient can drive up to [facility]'s testing site.

## 2019-01-15 ENCOUNTER — Encounter: Payer: Self-pay | Admitting: Family Medicine

## 2019-01-15 LAB — NOVEL CORONAVIRUS, NAA: SARS-CoV-2, NAA: NOT DETECTED

## 2019-01-26 ENCOUNTER — Other Ambulatory Visit: Payer: Self-pay | Admitting: Family Medicine

## 2019-01-26 DIAGNOSIS — I10 Essential (primary) hypertension: Secondary | ICD-10-CM

## 2019-01-27 ENCOUNTER — Other Ambulatory Visit: Payer: Self-pay | Admitting: Family Medicine

## 2019-01-27 DIAGNOSIS — I10 Essential (primary) hypertension: Secondary | ICD-10-CM

## 2019-04-21 ENCOUNTER — Other Ambulatory Visit: Payer: Self-pay | Admitting: Family Medicine

## 2019-04-21 DIAGNOSIS — K219 Gastro-esophageal reflux disease without esophagitis: Secondary | ICD-10-CM

## 2019-04-30 ENCOUNTER — Other Ambulatory Visit: Payer: Self-pay

## 2019-04-30 DIAGNOSIS — K219 Gastro-esophageal reflux disease without esophagitis: Secondary | ICD-10-CM

## 2019-04-30 MED ORDER — PANTOPRAZOLE SODIUM 40 MG PO TBEC: 40.0000 mg | DELAYED_RELEASE_TABLET | Freq: Every day | ORAL | 0 refills | Status: DC

## 2019-05-06 ENCOUNTER — Ambulatory Visit (INDEPENDENT_AMBULATORY_CARE_PROVIDER_SITE_OTHER): Payer: 59 | Admitting: Family Medicine

## 2019-05-06 ENCOUNTER — Other Ambulatory Visit: Payer: Self-pay

## 2019-05-06 ENCOUNTER — Encounter: Payer: Self-pay | Admitting: Family Medicine

## 2019-05-06 VITALS — BP 100/70 | HR 69 | Temp 97.5°F | Resp 16 | Ht 72.0 in | Wt 169.7 lb

## 2019-05-06 DIAGNOSIS — I700 Atherosclerosis of aorta: Secondary | ICD-10-CM

## 2019-05-06 DIAGNOSIS — J431 Panlobular emphysema: Secondary | ICD-10-CM | POA: Diagnosis not present

## 2019-05-06 DIAGNOSIS — K219 Gastro-esophageal reflux disease without esophagitis: Secondary | ICD-10-CM

## 2019-05-06 DIAGNOSIS — R590 Localized enlarged lymph nodes: Secondary | ICD-10-CM

## 2019-05-06 DIAGNOSIS — E7800 Pure hypercholesterolemia, unspecified: Secondary | ICD-10-CM

## 2019-05-06 DIAGNOSIS — I10 Essential (primary) hypertension: Secondary | ICD-10-CM

## 2019-05-06 DIAGNOSIS — N528 Other male erectile dysfunction: Secondary | ICD-10-CM | POA: Diagnosis not present

## 2019-05-06 DIAGNOSIS — Z122 Encounter for screening for malignant neoplasm of respiratory organs: Secondary | ICD-10-CM

## 2019-05-06 DIAGNOSIS — Z0000 Encounter for general adult medical examination without abnormal findings: Secondary | ICD-10-CM

## 2019-05-06 MED ORDER — ROSUVASTATIN CALCIUM 5 MG PO TABS: 5.0000 mg | ORAL_TABLET | Freq: Every day | ORAL | 1 refills | Status: DC

## 2019-05-06 MED ORDER — PANTOPRAZOLE SODIUM 40 MG PO TBEC: 40.0000 mg | DELAYED_RELEASE_TABLET | Freq: Every day | ORAL | 1 refills | Status: DC

## 2019-05-06 MED ORDER — VALSARTAN 80 MG PO TABS: 80.0000 mg | ORAL_TABLET | Freq: Every day | ORAL | 0 refills | Status: DC

## 2019-05-06 NOTE — Patient Instructions (Signed)
Preventive Care 40-62 Years Old, Male Preventive care refers to lifestyle choices and visits with your health care provider that can promote health and wellness. This includes:  A yearly physical exam. This is also called an annual well check.  Regular dental and eye exams.  Immunizations.  Screening for certain conditions.  Healthy lifestyle choices, such as eating a healthy diet, getting regular exercise, not using drugs or products that contain nicotine and tobacco, and limiting alcohol use. What can I expect for my preventive care visit? Physical exam Your health care provider will check:  Height and weight. These may be used to calculate body mass index (BMI), which is a measurement that tells if you are at a healthy weight.  Heart rate and blood pressure.  Your skin for abnormal spots. Counseling Your health care provider may ask you questions about:  Alcohol, tobacco, and drug use.  Emotional well-being.  Home and relationship well-being.  Sexual activity.  Eating habits.  Work and work environment. What immunizations do I need?  Influenza (flu) vaccine  This is recommended every year. Tetanus, diphtheria, and pertussis (Tdap) vaccine  You may need a Td booster every 10 years. Varicella (chickenpox) vaccine  You may need this vaccine if you have not already been vaccinated. Zoster (shingles) vaccine  You may need this after age 60. Measles, mumps, and rubella (MMR) vaccine  You may need at least one dose of MMR if you were born in 1957 or later. You may also need a second dose. Pneumococcal conjugate (PCV13) vaccine  You may need this if you have certain conditions and were not previously vaccinated. Pneumococcal polysaccharide (PPSV23) vaccine  You may need one or two doses if you smoke cigarettes or if you have certain conditions. Meningococcal conjugate (MenACWY) vaccine  You may need this if you have certain conditions. Hepatitis A vaccine   You may need this if you have certain conditions or if you travel or work in places where you may be exposed to hepatitis A. Hepatitis B vaccine  You may need this if you have certain conditions or if you travel or work in places where you may be exposed to hepatitis B. Haemophilus influenzae type b (Hib) vaccine  You may need this if you have certain risk factors. Human papillomavirus (HPV) vaccine  If recommended by your health care provider, you may need three doses over 6 months. You may receive vaccines as individual doses or as more than one vaccine together in one shot (combination vaccines). Talk with your health care provider about the risks and benefits of combination vaccines. What tests do I need? Blood tests  Lipid and cholesterol levels. These may be checked every 5 years, or more frequently if you are over 50 years old.  Hepatitis C test.  Hepatitis B test. Screening  Lung cancer screening. You may have this screening every year starting at age 55 if you have a 30-pack-year history of smoking and currently smoke or have quit within the past 15 years.  Prostate cancer screening. Recommendations will vary depending on your family history and other risks.  Colorectal cancer screening. All adults should have this screening starting at age 50 and continuing until age 75. Your health care provider may recommend screening at age 45 if you are at increased risk. You will have tests every 1-10 years, depending on your results and the type of screening test.  Diabetes screening. This is done by checking your blood sugar (glucose) after you have not eaten   for a while (fasting). You may have this done every 1-3 years.  Sexually transmitted disease (STD) testing. Follow these instructions at home: Eating and drinking  Eat a diet that includes fresh fruits and vegetables, whole grains, lean protein, and low-fat dairy products.  Take vitamin and mineral supplements as recommended  by your health care provider.  Do not drink alcohol if your health care provider tells you not to drink.  If you drink alcohol: ? Limit how much you have to 0-2 drinks a day. ? Be aware of how much alcohol is in your drink. In the [place] one drink equals one 12 oz bottle of beer (355 mL), one 5 oz glass of wine (148 mL), or one 1 oz glass of hard liquor (44 mL). Lifestyle  Take daily care of your teeth and gums.  Stay active. Exercise for at least 30 minutes on 5 or more days each week.  Do not use any products that contain nicotine or tobacco, such as cigarettes, e-cigarettes, and chewing tobacco. If you need help quitting, ask your health care provider.  If you are sexually active, practice safe sex. Use a condom or other form of protection to prevent STIs (sexually transmitted infections).  Talk with your health care provider about taking a low-dose aspirin every day starting at age 33. What's next?  Go to your health care provider once a year for a well check visit.  Ask your health care provider how often you should have your eyes and teeth checked.  Stay up to date on all vaccines. This information is not intended to replace advice given to you by your health care provider. Make sure you discuss any questions you have with your health care provider. Document Released: 07/08/2015 Document Revised: 06/05/2018 Document Reviewed: 06/05/2018 Elsevier Patient Education  2020 [employer].

## 2019-05-06 NOTE — Progress Notes (Signed)
Name: Aaron Ross   MRN: [id number]    DOB: Apr 12, 1956   Date:05/06/2019       Progress Note  Subjective  Chief Complaint  Chief Complaint  Patient presents with  . Annual Exam    HPI  Patient presents for annual CPE and follow up  HTN: he takes medication as prescribed and denies side effects, no chest pain or palpitation.BP today was towards low end of normal, he states at home bp can 100 SBP.  Atherosclerosis of Aorta: found on CXR done 10/13/2016, he is on statin therapy and aspirin and discussed importance of keeping bp controlled We will recheck labs today   Hyperlipidemia: taking crestor and denies side effects of medications. Last LDL was at goal.   ED: he is doing well with Viagra , helps to start and maintain an erection   GERD: he has been taking pantoprazole for a long time , discussed side effects of medication including colitis, heart disease or osteoporosis.He states symptoms controlled with medication, he states if he skips one day he notices severe symptoms   Hyperglycemia: he denies polyphagia, polyuria or polydipsia, last hgbA1C was 5.6%.  Emphysema: long history of tobacco use and emphysematous change on CXR done 10/11/2017. He quit smoking 06/2017, he denies wheezing,or cough,SOB only with activity but able to walk his dog for 40 minutes daily. He states he bought a CPAP machine online and is breathing better, we will get home health to check oxygen at night Pressure of 8 cmH20. Never had a sleep study, explained not safe. Today spirometry showed: FEV1/FVC was 82% and after albuterol it went down , discussed CT lung cancer screening and he is willing to have it done  History of COVID-19: he states back to his baseline   USPSTF grade A and B recommendations:  Diet: little red meat, mostly lean meat, high in fish, he also eats vegetables and some fruit  Exercise: walking dog daily also does some weight training at home   Depression: phq 9 is  negative Depression screen [facility] 2/9 05/06/2019 10/28/2018 04/29/2018 04/08/2018 07/22/2017  Decreased Interest 0 0 0 0 0  Down, Depressed, Hopeless 0 0 0 0 0  PHQ - 2 Score 0 0 0 0 0  Altered sleeping 0 1 0 0 -  Tired, decreased energy 0 1 0 0 -  Change in appetite 0 0 0 0 -  Feeling bad or failure about yourself  0 0 0 0 -  Trouble concentrating 0 0 0 0 -  Moving slowly or fidgety/restless 0 1 0 0 -  Suicidal thoughts 0 0 0 0 -  PHQ-9 Score 0 3 0 0 -  Difficult doing work/chores - Not difficult at all Not difficult at all Not difficult at all -    Hypertension:  BP Readings from Last 3 Encounters:  05/06/19 100/70  10/28/18 118/64  04/29/18 118/82    Obesity: Wt Readings from Last 3 Encounters:  05/06/19 169 lb 11.2 oz (77 kg)  10/28/18 171 lb 4.8 oz (77.7 kg)  04/29/18 167 lb 12.8 oz (76.1 kg)   BMI Readings from Last 3 Encounters:  05/06/19 23.02 kg/m  10/28/18 22.60 kg/m  04/29/18 22.14 kg/m     Lipids:  Lab Results  Component Value Date   CHOL 165 10/28/2018   CHOL 161 10/23/2017   CHOL 211 (H) 01/17/2017   Lab Results  Component Value Date   HDL 63 10/28/2018   HDL 64 10/23/2017   HDL 62 01/17/2017  Lab Results  Component Value Date   LDLCALC 77 10/28/2018   LDLCALC 72 10/23/2017   LDLCALC 128 (H) 01/17/2017   Lab Results  Component Value Date   TRIG 149 10/28/2018   TRIG 169 (H) 10/23/2017   TRIG 106 01/17/2017   Lab Results  Component Value Date   CHOLHDL 2.6 10/28/2018   CHOLHDL 2.5 10/23/2017   CHOLHDL 3.4 01/17/2017   No results found for: LDLDIRECT Glucose:  Glucose, Bld  Date Value Ref Range Status  10/28/2018 112 (H) 65 - 99 mg/dL Final    Comment:    .            Fasting reference interval . For someone without known diabetes, a glucose value between 100 and 125 mg/dL is consistent with prediabetes and should be confirmed with a follow-up test. .   10/23/2017 96 65 - 139 mg/dL Final    Comment:    .        Non-fasting  reference interval .   10/11/2016 117 (H) 65 - 99 mg/dL Final      Office Visit from 10/28/2018 in [facility]  AUDIT-C Score  4      Married STD testing and prevention (HIV/chl/gon/syphilis): N/A Hep C: up to date   Skin cancer: seeing Dermatologist every 6 months  Colorectal cancer: up to date  Prostate cancer: discussed USPTF  IPSS Questionnaire (AUA-7): Over the past month.   1)  How often have you had a sensation of not emptying your bladder completely after you finish urinating?  0 - Not at all  2)  How often have you had to urinate again less than two hours after you finished urinating? 0 - Not at all  3)  How often have you found you stopped and started again several times when you urinated?  0 - Not at all  4) How difficult have you found it to postpone urination?  0 - Not at all  5) How often have you had a weak urinary stream?  0 - Not at all  6) How often have you had to push or strain to begin urination?  0 - Not at all  7) How many times did you most typically get up to urinate from the time you went to bed until the time you got up in the morning?  0 - None  Total score:  0-7 mildly symptomatic   8-19 moderately symptomatic   20-35 severely symptomatic    Lung cancer:  Low Dose CT Chest recommended if Age 46-80 years, 30 pack-year currently smoking OR have quit w/in 15years. Patient does qualify.   AAA:  The USPSTF recommends one-time screening with ultrasonography in men ages 59 to 59 years who have ever smoked ECG:  09/2016   Advanced Care Planning: A voluntary discussion about advance care planning including the explanation and discussion of advance directives.  Discussed health care proxy and Living will, and the patient was able to identify a health care proxy as wife .  Patient does have a living will at present time.   Patient Active Problem List   Diagnosis Date Noted  . Atherosclerosis of aorta (HCC) 10/23/2017  . History of  pneumothorax 10/11/2016  . Hyperlipidemia 11/29/2015  . GERD (gastroesophageal reflux disease) 11/29/2015  . HTN (hypertension) 01/11/2015  . COPD (chronic obstructive pulmonary disease) (HCC) 01/11/2015  . Erectile dysfunction 01/11/2015  . Hyperglycemia 01/11/2015    Past Surgical History:  Procedure Laterality Date  .  COSMETIC SURGERY    . EYE SURGERY    . HERNIA REPAIR      Family History  Problem Relation Age of Onset  . Hypertension Father   . Prostate cancer Neg Hx   . Bladder Cancer Neg Hx   . Kidney cancer Neg Hx     Social History   Socioeconomic History  . Marital status: Married    Spouse name: Not on file  . Number of children: 0  . Years of education: Not on file  . Highest education level: 9th grade  Occupational History  . Occupation: Customer service manager   Social Needs  . Financial resource strain: Not hard at all  . Food insecurity    Worry: Never true    Inability: Never true  . Transportation needs    Medical: No    Non-medical: No  Tobacco Use  . Smoking status: Former Smoker    Packs/day: 2.00    Years: 43.00    Pack years: 86.00    Types: Cigarettes    Start date: 06/25/1972    Quit date: 07/15/2017    Years since quitting: 1.8  . Smokeless tobacco: Never Used  Substance and Sexual Activity  . Alcohol use: Yes    Alcohol/week: 8.0 standard drinks    Types: 8 Standard drinks or equivalent per week  . Drug use: No  . Sexual activity: Yes    Partners: Female    Birth control/protection: Surgical  Lifestyle  . Physical activity    Days per week: 7 days    Minutes per session: 40 min  . Stress: Not at all  Relationships  . Social Musician on phone: Once a week    Gets together: Never    Attends religious service: Never    Active member of club or organization: No    Attends meetings of clubs or organizations: Never    Relationship status: Married  . Intimate partner violence    Fear of current or ex partner: No     Emotionally abused: No    Physically abused: No    Forced sexual activity: No  Other Topics Concern  . Not on file  Social History Narrative   Divorced, never had children, remarried January 2020, she is from [place]    He works full time in [place], Press photographer   He is originally from [place] Moved here with parents and siblings when he was 16.    Siblings still in [place]     Current Outpatient Medications:  .  aspirin 81 MG EC tablet, TAKE 1 TABLET BY MOUTH EVERY DAY, Disp: 30 tablet, Rfl: 0 .  pantoprazole (PROTONIX) 40 MG tablet, Take 1 tablet (40 mg total) by mouth daily., Disp: 90 tablet, Rfl: 1 .  rosuvastatin (CRESTOR) 5 MG tablet, Take 1 tablet (5 mg total) by mouth daily., Disp: 90 tablet, Rfl: 1 .  sildenafil (VIAGRA) 100 MG tablet, Take 1 tablet (100 mg total) by mouth daily as needed for erectile dysfunction., Disp: 30 tablet, Rfl: 0 .  valsartan (DIOVAN) 160 MG tablet, Take 1 tablet (160 mg total) by mouth daily., Disp: 90 tablet, Rfl: 1  No Known Allergies   ROS  Constitutional: Negative for fever or weight change.  Respiratory: Negative for cough and shortness of breath.   Cardiovascular: Negative for chest pain or palpitations.  Gastrointestinal: Negative for abdominal pain, no bowel changes.  Musculoskeletal: Negative for gait problem or joint swelling.  Skin: Negative for rash.  Neurological: Negative for dizziness or headache.  No other specific complaints in a complete review of systems (except as listed in HPI above).  Objective  Vitals:   05/06/19 0755  BP: 100/70  Pulse: 69  Resp: 16  Temp: (!) 97.5 F (36.4 C)  TempSrc: Temporal  SpO2: 96%  Weight: 169 lb 11.2 oz (77 kg)  Height: 6' (1.829 m)    Body mass index is 23.02 kg/m.  Physical Exam  Constitutional: Patient appears well-developed and well-nourished. No distress.  HENT: Head: Normocephalic and atraumatic. Ears: B TMs ok, no erythema or effusion; Nose: not examined , oral mucosa not done   Eyes: Conjunctivae and EOM are normal. Pupils are equal, round, and reactive to light. No scleral icterus.  Neck: Normal range of motion. Neck supple. No JVD present. No thyromegaly present. Large non tender anterior lymphadenopathy on right side  Cardiovascular: Normal rate, regular rhythm and normal heart sounds.  No murmur heard. No BLE edema. Pulmonary/Chest: Effort normal and breath sounds normal. No respiratory distress. Abdominal: Soft. Bowel sounds are normal, no distension. There is no tenderness. no masses MALE GENITALIA: Normal descended testes bilaterally, no masses palpated, no hernias, no lesions, no discharge RECTAL: not done  Musculoskeletal: Normal range of motion, no joint effusions. No gross deformities Neurological: he is alert and oriented to person, place, and time. No cranial nerve deficit. Coordination, balance, strength, speech and gait are normal.  Skin: Skin is warm and dry. No rash noted. No erythema.  Psychiatric: Patient has a normal mood and affect. behavior is normal. Judgment and thought content normal.   PHQ2/9: Depression screen [facility] 2/9 05/06/2019 10/28/2018 04/29/2018 04/08/2018 07/22/2017  Decreased Interest 0 0 0 0 0  Down, Depressed, Hopeless 0 0 0 0 0  PHQ - 2 Score 0 0 0 0 0  Altered sleeping 0 1 0 0 -  Tired, decreased energy 0 1 0 0 -  Change in appetite 0 0 0 0 -  Feeling bad or failure about yourself  0 0 0 0 -  Trouble concentrating 0 0 0 0 -  Moving slowly or fidgety/restless 0 1 0 0 -  Suicidal thoughts 0 0 0 0 -  PHQ-9 Score 0 3 0 0 -  Difficult doing work/chores - Not difficult at all Not difficult at all Not difficult at all -   Negative   Fall Risk: Fall Risk  05/06/2019 10/28/2018 04/29/2018 04/11/2018 04/08/2018  Falls in the past year? 0 0 0 No No  Number falls in past yr: 0 0 - - -  Injury with Fall? 0 0 - - -      Assessment & Plan  1. Pure hypercholesterolemia  - rosuvastatin (CRESTOR) 5 MG tablet; Take 1 tablet (5 mg  total) by mouth daily.  Dispense: 90 tablet; Refill: 1  2. Atherosclerosis of aorta (HCC)   3. Panlobular emphysema (HCC)  - CT CHEST LUNG CA SCREEN LOW DOSE W/O CM; Future  4. Other male erectile dysfunction  On viagra prn   5. Essential hypertension  - valsartan (DIOVAN) 80 MG tablet; Take 1 tablet (80 mg total) by mouth daily.  Dispense: 90 tablet; Refill: 0  6. Well adult exam   7. Gastroesophageal reflux disease without esophagitis  - pantoprazole (PROTONIX) 40 MG tablet; Take 1 tablet (40 mg total) by mouth daily.  Dispense: 90 tablet; Refill: 1  8. Encounter for screening for lung cancer  - CT CHEST LUNG CA SCREEN LOW DOSE W/O CM; Future  9. Lymphadenopathy of right cervical region  - Ambulatory referral to ENT  -Prostate cancer screening and PSA options (with potential risks and benefits of testing vs not testing) were discussed along with recent recs/guidelines. -USPSTF grade A and B recommendations reviewed with patient; age-appropriate recommendations, preventive care, screening tests, etc discussed and encouraged; healthy living encouraged; see AVS for patient education given to patient -Discussed importance of 150 minutes of physical activity weekly, eat two servings of fish weekly, eat one serving of tree nuts ( cashews, pistachios, pecans, almonds.Marland Kitchen) every other day, eat 6 servings of fruit/vegetables daily and drink plenty of water and avoid sweet beverages.

## 2019-05-07 ENCOUNTER — Telehealth: Payer: Self-pay | Admitting: *Deleted

## 2019-05-07 ENCOUNTER — Encounter: Payer: Self-pay | Admitting: Family Medicine

## 2019-05-07 DIAGNOSIS — Z87891 Personal history of nicotine dependence: Secondary | ICD-10-CM

## 2019-05-07 DIAGNOSIS — Z122 Encounter for screening for malignant neoplasm of respiratory organs: Secondary | ICD-10-CM

## 2019-05-07 NOTE — Telephone Encounter (Signed)
Received referral for initial lung cancer screening scan. Contacted patient and obtained smoking history,(former, quit 07/15/17, 86 pack year) as well as answering questions related to screening process. Patient denies signs of lung cancer such as weight loss or hemoptysis. Patient denies comorbidity that would prevent curative treatment if lung cancer were found. Patient is scheduled for shared decision making visit and CT scan on 05/19/19 at 145pm.

## 2019-05-13 ENCOUNTER — Other Ambulatory Visit: Payer: Self-pay | Admitting: Family Medicine

## 2019-05-13 DIAGNOSIS — K219 Gastro-esophageal reflux disease without esophagitis: Secondary | ICD-10-CM

## 2019-05-13 MED ORDER — PANTOPRAZOLE SODIUM 40 MG PO TBEC: 40.0000 mg | DELAYED_RELEASE_TABLET | Freq: Every day | ORAL | 1 refills | Status: DC

## 2019-05-15 ENCOUNTER — Other Ambulatory Visit: Payer: Self-pay

## 2019-05-15 ENCOUNTER — Other Ambulatory Visit: Payer: Self-pay | Admitting: Unknown Physician Specialty

## 2019-05-15 DIAGNOSIS — K219 Gastro-esophageal reflux disease without esophagitis: Secondary | ICD-10-CM

## 2019-05-15 DIAGNOSIS — R221 Localized swelling, mass and lump, neck: Secondary | ICD-10-CM

## 2019-05-17 MED ORDER — PANTOPRAZOLE SODIUM 40 MG PO TBEC: 40.0000 mg | DELAYED_RELEASE_TABLET | Freq: Every day | ORAL | 1 refills | Status: DC

## 2019-05-18 ENCOUNTER — Other Ambulatory Visit: Payer: Self-pay | Admitting: Family Medicine

## 2019-05-18 DIAGNOSIS — K219 Gastro-esophageal reflux disease without esophagitis: Secondary | ICD-10-CM

## 2019-05-18 MED ORDER — PANTOPRAZOLE SODIUM 40 MG PO TBEC: 40.0000 mg | DELAYED_RELEASE_TABLET | Freq: Every day | ORAL | 1 refills | Status: DC

## 2019-05-19 ENCOUNTER — Inpatient Hospital Stay: Payer: 59 | Attending: Nurse Practitioner | Admitting: Oncology

## 2019-05-19 ENCOUNTER — Other Ambulatory Visit: Payer: Self-pay

## 2019-05-19 ENCOUNTER — Other Ambulatory Visit: Payer: Self-pay | Admitting: Family Medicine

## 2019-05-19 ENCOUNTER — Ambulatory Visit
Admission: RE | Admit: 2019-05-19 | Discharge: 2019-05-19 | Disposition: A | Discharge status: Home / Self Care | Payer: 59 | Source: Ambulatory Visit | Attending: Nurse Practitioner | Admitting: Nurse Practitioner

## 2019-05-19 DIAGNOSIS — Z87891 Personal history of nicotine dependence: Secondary | ICD-10-CM | POA: Diagnosis not present

## 2019-05-19 DIAGNOSIS — Z122 Encounter for screening for malignant neoplasm of respiratory organs: Secondary | ICD-10-CM | POA: Diagnosis present

## 2019-05-19 DIAGNOSIS — K219 Gastro-esophageal reflux disease without esophagitis: Secondary | ICD-10-CM

## 2019-05-19 MED ORDER — PANTOPRAZOLE SODIUM 40 MG PO TBEC: 40.0000 mg | DELAYED_RELEASE_TABLET | Freq: Every day | ORAL | 1 refills | Status: DC

## 2019-05-19 NOTE — Progress Notes (Signed)
Virtual Visit via Video Note  I connected with Mr. Toran on 05/19/19 at  1:45 PM EST by a video enabled telemedicine application and verified that I am speaking with the correct person using two identifiers.  Location: Patient: [facility] Provider: Office   I discussed the limitations of evaluation and management by telemedicine and the availability of in person appointments. The patient expressed understanding and agreed to proceed.  I discussed the assessment and treatment plan with the patient. The patient was provided an opportunity to ask questions and all were answered. The patient agreed with the plan and demonstrated an understanding of the instructions.   The patient was advised to call back or seek an in-person evaluation if the symptoms worsen or if the condition fails to improve as anticipated.   In accordance with CMS guidelines, patient has met eligibility criteria including age, absence of signs or symptoms of lung cancer.  Social History   Tobacco Use  . Smoking status: Former Smoker    Packs/day: 2.00    Years: 43.00    Pack years: 86.00    Types: Cigarettes    Start date: 06/25/1972    Quit date: 07/15/2017    Years since quitting: 1.8  . Smokeless tobacco: Never Used  Substance Use Topics  . Alcohol use: Yes    Alcohol/week: 8.0 standard drinks    Types: 8 Standard drinks or equivalent per week  . Drug use: No      A shared decision-making session was conducted prior to the performance of CT scan. This includes one or more decision aids, includes benefits and harms of screening, follow-up diagnostic testing, over-diagnosis, false positive rate, and total radiation exposure.   Counseling on the importance of adherence to annual lung cancer LDCT screening, impact of co-morbidities, and ability or willingness to undergo diagnosis and treatment is imperative for compliance of the program.   Counseling on the importance of continued smoking cessation for former smokers; the  importance of smoking cessation for current smokers, and information about tobacco cessation interventions have been given to patient including [facility] and 1800 quit  programs.   Written order for lung cancer screening with LDCT has been given to the patient and any and all questions have been answered to the best of my abilities.    Yearly follow up will be coordinated by Burgess Estelle, Thoracic Navigator.  I provided 15 minutes of face-to-face video visit time during this encounter, and > 50% was spent counseling as documented under my assessment & plan.   Jacquelin Hawking, NP

## 2019-05-20 ENCOUNTER — Telehealth: Payer: Self-pay | Admitting: *Deleted

## 2019-05-20 NOTE — Progress Notes (Signed)
Pt is schedueld.

## 2019-05-20 NOTE — Telephone Encounter (Signed)
After review of lung screening results with PCP, thoracic navigator, and NP, patient contacted and given option of 3 month follow up Ct scan vs our recommendation for PET scan. He is agreeable to having PET scan and will be contacted soon for scheduling.

## 2019-05-25 ENCOUNTER — Other Ambulatory Visit: Payer: Self-pay | Admitting: Nurse Practitioner

## 2019-05-25 ENCOUNTER — Other Ambulatory Visit: Payer: Self-pay

## 2019-05-25 ENCOUNTER — Encounter: Payer: Self-pay | Admitting: Family Medicine

## 2019-05-25 ENCOUNTER — Encounter: Payer: Self-pay | Admitting: Nurse Practitioner

## 2019-05-25 DIAGNOSIS — K219 Gastro-esophageal reflux disease without esophagitis: Secondary | ICD-10-CM

## 2019-05-25 DIAGNOSIS — R911 Solitary pulmonary nodule: Secondary | ICD-10-CM

## 2019-05-25 MED ORDER — PANTOPRAZOLE SODIUM 40 MG PO TBEC: 40.0000 mg | DELAYED_RELEASE_TABLET | Freq: Every day | ORAL | 1 refills | Status: DC

## 2019-05-25 NOTE — Progress Notes (Signed)
Aaron Ross, 62 y.o. male who was referred to the lung cancer screening program/low-dose CT for use of tobacco products.  he had a low-dose chest CT on 05/19/2019 which noted dominant 17.9 mm pleural based nodule int he medial left lung base, 9.7 mm nodule in the posterior right upper lobe. Findigns concerning for primary bronchogenic carcinoma/underlying malignancy.  he will require PET scan to further evaluate and assist in medical decision-making including possible biopsy and to optimize management. Based on results will plan to refer patient for further management.

## 2019-05-25 NOTE — Telephone Encounter (Signed)
Prior authorization has been approved for 1 year. Reference # is [id number].

## 2019-06-03 ENCOUNTER — Other Ambulatory Visit: Payer: Self-pay

## 2019-06-03 ENCOUNTER — Ambulatory Visit
Admission: RE | Admit: 2019-06-03 | Discharge: 2019-06-03 | Disposition: A | Discharge status: Home / Self Care | Payer: 59 | Source: Ambulatory Visit | Attending: Unknown Physician Specialty | Admitting: Unknown Physician Specialty

## 2019-06-03 DIAGNOSIS — R221 Localized swelling, mass and lump, neck: Secondary | ICD-10-CM | POA: Insufficient documentation

## 2019-06-03 LAB — POCT I-STAT CREATININE: Creatinine, Ser: 0.9 mg/dL (ref 0.61–1.24)

## 2019-06-03 MED ORDER — IOHEXOL 300 MG/ML  SOLN
75.0000 mL | Freq: Once | INTRAMUSCULAR | Status: AC | PRN
  Administered 2019-06-03: 75 mL via INTRAVENOUS

## 2019-06-04 ENCOUNTER — Encounter
Admission: RE | Admit: 2019-06-04 | Discharge: 2019-06-04 | Disposition: A | Discharge status: Home / Self Care | Payer: No Typology Code available for payment source | Source: Ambulatory Visit | Attending: Nurse Practitioner | Admitting: Nurse Practitioner

## 2019-06-04 ENCOUNTER — Other Ambulatory Visit: Payer: Self-pay

## 2019-06-04 DIAGNOSIS — R911 Solitary pulmonary nodule: Secondary | ICD-10-CM | POA: Insufficient documentation

## 2019-06-04 LAB — GLUCOSE, CAPILLARY: Glucose-Capillary: 78 mg/dL (ref 70–99)

## 2019-06-04 MED ORDER — FLUDEOXYGLUCOSE F - 18 (FDG) INJECTION
8.8000 | Freq: Once | INTRAVENOUS | Status: AC | PRN
  Administered 2019-06-04: 09:00:00 9.56 via INTRAVENOUS

## 2019-06-05 ENCOUNTER — Telehealth: Payer: Self-pay | Admitting: *Deleted

## 2019-06-05 NOTE — Telephone Encounter (Signed)
After reviewing results of CT and PET with pulmonology, patient is contacted and given PET scan results as well as recommendation for 3 month follow up imaging from pulmonology.   Patient verbalizes understanding and is in agreement with this plan.  IMPRESSION: 9 mm spiculated nodule in the posterior right upper lobe shows minimal FDG uptake but is not hypermetabolic. Small bronchogenic carcinoma cannot be excluded given its small size and morphology. Given severity of emphysema, consider continued follow-up by chest CT in 6 months.  Well-circumscribed 1.8 cm nodule in the posterior left lower lobe shows complete absence of FDG uptake, suggesting benign etiology. Continued follow-up by chest CT is recommended.  No evidence of hypermetabolic thoracic lymphadenopathy or extrathoracic disease.  Severe centrilobular emphysema.

## 2019-07-21 ENCOUNTER — Other Ambulatory Visit: Payer: Self-pay | Admitting: Family Medicine

## 2019-07-21 DIAGNOSIS — I10 Essential (primary) hypertension: Secondary | ICD-10-CM

## 2019-08-11 ENCOUNTER — Telehealth: Payer: Self-pay | Admitting: *Deleted

## 2019-08-11 ENCOUNTER — Ambulatory Visit: Payer: 59 | Admitting: Family Medicine

## 2019-08-11 ENCOUNTER — Encounter: Payer: Self-pay | Admitting: Family Medicine

## 2019-08-11 ENCOUNTER — Other Ambulatory Visit: Payer: Self-pay

## 2019-08-11 VITALS — BP 136/74 | HR 80 | Temp 97.3°F | Resp 18 | Ht 72.0 in | Wt 170.7 lb

## 2019-08-11 DIAGNOSIS — I10 Essential (primary) hypertension: Secondary | ICD-10-CM | POA: Diagnosis not present

## 2019-08-11 DIAGNOSIS — K219 Gastro-esophageal reflux disease without esophagitis: Secondary | ICD-10-CM | POA: Diagnosis not present

## 2019-08-11 DIAGNOSIS — R911 Solitary pulmonary nodule: Secondary | ICD-10-CM

## 2019-08-11 DIAGNOSIS — E7800 Pure hypercholesterolemia, unspecified: Secondary | ICD-10-CM

## 2019-08-11 DIAGNOSIS — Z87891 Personal history of nicotine dependence: Secondary | ICD-10-CM

## 2019-08-11 DIAGNOSIS — J431 Panlobular emphysema: Secondary | ICD-10-CM | POA: Diagnosis not present

## 2019-08-11 DIAGNOSIS — I700 Atherosclerosis of aorta: Secondary | ICD-10-CM

## 2019-08-11 DIAGNOSIS — N528 Other male erectile dysfunction: Secondary | ICD-10-CM

## 2019-08-11 DIAGNOSIS — R918 Other nonspecific abnormal finding of lung field: Secondary | ICD-10-CM

## 2019-08-11 NOTE — Telephone Encounter (Signed)
 (  08/11/2019) Patient has been notified that lung cancer screening CT scan is due currently or will be in near future. Confirmed that patient is within the appropriate age range, and asymptomatic. Patient denies illness that would prevent curative treatment for lung cancer if found. Verified smoking history (Former Smoker since 2018, 3 ppd). Patient is agreeable for CT scan being scheduled; prefers appt in the afternoon SRW

## 2019-08-11 NOTE — Progress Notes (Signed)
Name: Aaron Ross   MRN: [id number]    DOB: 04-01-1956   Date:08/11/2019       Progress Note  Subjective  Chief Complaint  Chief Complaint  Patient presents with  . Hypertension    follow up  . Hyperlipidemia    HPI  HTN: he takes medication as prescribed and denies side effects, no chest pain or palpitation.Since we decreased dose of Diovan he denies any dizziness, he is on 80 mg now and bp is at goal.   Atherosclerosis of Aorta: found on CXR done 10/13/2016, he is on statin therapy and aspirin, no side effects   Hyperlipidemia: taking crestor and denies side effects of medications. Last LDL was at goal. We will recheck yearly   ED: he is doing well with Viagra , helps to start and maintain an erection He still has medications at home  GERD: he has been taking pantoprazole for a long time , discussed side effects of medication including colitis, heart disease or osteoporosis.He states symptoms controlled with medication, he states if he skips one day he notices severe symptoms , he states no symptoms while on medication  Hyperglycemia: he denies polyphagia, polyuria or polydipsia, last hgbA1C was 5.6%., we will recheck it yearly   Emphysema: long history of tobacco use and emphysematous change on CXR done 10/11/2017. He quit smoking 06/2017, he denies wheezing,or cough,SOB only after shower or getting out of hot tub, able to walk his dog for 20-30 minutes without problems. He states he bought a CPAP machine online and is breathing better, we will get home health to check oxygen at night Pressure of 8 cmH20. Never had a sleep study, explained not safe. Discussed result for CT lung cancer screen done end of 2020 and because of nodules he will go back for a repeat study soon    Patient Active Problem List   Diagnosis Date Noted  . Atherosclerosis of aorta (HCC) 10/23/2017  . History of pneumothorax 10/11/2016  . Hyperlipidemia 11/29/2015  . GERD (gastroesophageal reflux  disease) 11/29/2015  . HTN (hypertension) 01/11/2015  . COPD (chronic obstructive pulmonary disease) (HCC) 01/11/2015  . Erectile dysfunction 01/11/2015  . Hyperglycemia 01/11/2015    Past Surgical History:  Procedure Laterality Date  . COSMETIC SURGERY    . EYE SURGERY    . HERNIA REPAIR      Family History  Problem Relation Age of Onset  . Hypertension Father   . Prostate cancer Neg Hx   . Bladder Cancer Neg Hx   . Kidney cancer Neg Hx     Social History   Tobacco Use  . Smoking status: Former Smoker    Packs/day: 2.00    Years: 43.00    Pack years: 86.00    Types: Cigarettes    Start date: 06/25/1972    Quit date: 07/15/2017    Years since quitting: 2.0  . Smokeless tobacco: Never Used  Substance Use Topics  . Alcohol use: Yes    Alcohol/week: 8.0 standard drinks    Types: 8 Standard drinks or equivalent per week  . Drug use: No     Current Outpatient Medications:  .  aspirin 81 MG EC tablet, TAKE 1 TABLET BY MOUTH EVERY DAY, Disp: 30 tablet, Rfl: 0 .  pantoprazole (PROTONIX) 40 MG tablet, Take 1 tablet (40 mg total) by mouth daily., Disp: 90 tablet, Rfl: 1 .  rosuvastatin (CRESTOR) 5 MG tablet, Take 1 tablet (5 mg total) by mouth daily., Disp: 90 tablet, Rfl: 1 .  sildenafil (VIAGRA) 100 MG tablet, Take 1 tablet (100 mg total) by mouth daily as needed for erectile dysfunction., Disp: 30 tablet, Rfl: 0 .  valsartan (DIOVAN) 80 MG tablet, Take 1 tablet (80 mg total) by mouth daily., Disp: 90 tablet, Rfl: 0  No Known Allergies  I personally reviewed active problem list, medication list, allergies, family history, social history, health maintenance with the patient/caregiver today.   ROS  Constitutional: Negative for fever or weight change.  Respiratory: Negative for cough, positive for mild  shortness of breath when he finishes his shower, seems to be related to humidity, able to walk his dog on a fast pace   Cardiovascular: Negative for chest pain or  palpitations.  Gastrointestinal: Negative for abdominal pain, no bowel changes.  Musculoskeletal: Negative for gait problem or joint swelling.  Skin: Negative for rash.  Neurological: Negative for dizziness or headache.  No other specific complaints in a complete review of systems (except as listed in HPI above).  Objective  Vitals:   08/11/19 1518  BP: 136/74  Pulse: 80  Resp: 18  Temp: (!) 97.3 F (36.3 C)  TempSrc: Temporal  SpO2: 99%  Weight: 170 lb 11.2 oz (77.4 kg)  Height: 6' (1.829 m)    Body mass index is 23.15 kg/m.  Physical Exam  Constitutional: Patient appears well-developed and well-nourished.  No distress.  HEENT: head atraumatic, normocephalic, pupils equal and reactive to light Cardiovascular: Normal rate, regular rhythm and normal heart sounds.  No murmur heard. No BLE edema. Pulmonary/Chest: Effort normal and breath sounds normal. No respiratory distress. Abdominal: Soft.  There is no tenderness. Psychiatric: Patient has a normal mood and affect. behavior is normal. Judgment and thought content normal.  Recent Results (from the past 2160 hour(s))  I-STAT creatinine     Status: None   Collection Time: 06/03/19  1:27 PM  Result Value Ref Range   Creatinine, Ser 0.90 0.61 - 1.24 mg/dL  Glucose, capillary     Status: None   Collection Time: 06/04/19  8:11 AM  Result Value Ref Range   Glucose-Capillary 78 70 - 99 mg/dL     OXB3/5: Depression screen [facility] 2/9 08/11/2019 05/06/2019 10/28/2018 04/29/2018 04/08/2018  Decreased Interest 0 0 0 0 0  Down, Depressed, Hopeless 0 0 0 0 0  PHQ - 2 Score 0 0 0 0 0  Altered sleeping 0 0 1 0 0  Tired, decreased energy 0 0 1 0 0  Change in appetite 0 0 0 0 0  Feeling bad or failure about yourself  0 0 0 0 0  Trouble concentrating 0 0 0 0 0  Moving slowly or fidgety/restless 0 0 1 0 0  Suicidal thoughts 0 0 0 0 0  PHQ-9 Score 0 0 3 0 0  Difficult doing work/chores Not difficult at all - Not difficult at all Not  difficult at all Not difficult at all    phq 9 is negative   Fall Risk: Fall Risk  08/11/2019 05/06/2019 10/28/2018 04/29/2018 04/11/2018  Falls in the past year? 0 0 0 0 No  Number falls in past yr: 0 0 0 - -  Injury with Fall? 0 0 0 - -  Follow up Falls evaluation completed - - - -      Functional Status Survey: Is the patient deaf or have difficulty hearing?: No Does the patient have difficulty seeing, even when wearing glasses/contacts?: No Does the patient have difficulty concentrating, remembering, or making decisions?: No Does the  patient have difficulty walking or climbing stairs?: No Does the patient have difficulty dressing or bathing?: No Does the patient have difficulty doing errands alone such as visiting a doctor's office or shopping?: No    Assessment & Plan   1. Panlobular emphysema ([place])  He has some sob when humidity is high but good exercise tolerance otherwise, we will continue to monitor   2. Atherosclerosis of aorta ([place])  On statin therapy   3. Essential hypertension  At goal , on lower dose of diovan   4. Gastroesophageal reflux disease without esophagitis  Doing well on medication  5. Lung nodule seen on imaging study  He already received a call from [facility] to schedule CT follow up  6. Other male erectile dysfunction   7. Pure hypercholesterolemia  On statin therapy

## 2019-08-13 ENCOUNTER — Encounter: Payer: Self-pay | Admitting: *Deleted

## 2019-08-13 ENCOUNTER — Telehealth: Payer: Self-pay | Admitting: *Deleted

## 2019-08-13 NOTE — Addendum Note (Signed)
Addended by: Jonne Ply on: 08/13/2019 09:40 AM   Modules accepted: Orders

## 2019-08-13 NOTE — Telephone Encounter (Signed)
error 

## 2019-08-13 NOTE — Telephone Encounter (Signed)
Smoking history: former, quit 07/15/17, 86 pack year

## 2019-08-19 ENCOUNTER — Ambulatory Visit
Admission: RE | Admit: 2019-08-19 | Discharge: 2019-08-19 | Disposition: A | Discharge status: Home / Self Care | Payer: 59 | Source: Ambulatory Visit | Attending: Nurse Practitioner | Admitting: Nurse Practitioner

## 2019-08-19 ENCOUNTER — Other Ambulatory Visit: Payer: Self-pay

## 2019-08-19 DIAGNOSIS — Z87891 Personal history of nicotine dependence: Secondary | ICD-10-CM | POA: Diagnosis not present

## 2019-08-19 DIAGNOSIS — R918 Other nonspecific abnormal finding of lung field: Secondary | ICD-10-CM

## 2019-08-21 ENCOUNTER — Encounter: Payer: Self-pay | Admitting: *Deleted

## 2019-09-08 ENCOUNTER — Encounter: Payer: Self-pay | Admitting: Family Medicine

## 2019-09-30 ENCOUNTER — Encounter: Payer: Self-pay | Admitting: Family Medicine

## 2019-10-23 ENCOUNTER — Other Ambulatory Visit: Payer: Self-pay | Admitting: Family Medicine

## 2019-10-23 DIAGNOSIS — I10 Essential (primary) hypertension: Secondary | ICD-10-CM

## 2019-10-25 MED ORDER — VALSARTAN 80 MG PO TABS: 80.0000 mg | ORAL_TABLET | Freq: Every day | ORAL | 0 refills | Status: DC

## 2019-11-03 ENCOUNTER — Other Ambulatory Visit: Payer: Self-pay

## 2019-11-03 ENCOUNTER — Ambulatory Visit: Payer: 59 | Admitting: Family Medicine

## 2019-11-03 ENCOUNTER — Other Ambulatory Visit: Payer: Self-pay | Admitting: Family Medicine

## 2019-11-03 ENCOUNTER — Encounter: Payer: Self-pay | Admitting: Family Medicine

## 2019-11-03 VITALS — BP 132/84 | HR 64 | Temp 97.7°F | Resp 18 | Ht 72.0 in | Wt 171.8 lb

## 2019-11-03 DIAGNOSIS — J431 Panlobular emphysema: Secondary | ICD-10-CM | POA: Diagnosis not present

## 2019-11-03 DIAGNOSIS — R911 Solitary pulmonary nodule: Secondary | ICD-10-CM

## 2019-11-03 DIAGNOSIS — D692 Other nonthrombocytopenic purpura: Secondary | ICD-10-CM

## 2019-11-03 DIAGNOSIS — E7800 Pure hypercholesterolemia, unspecified: Secondary | ICD-10-CM

## 2019-11-03 DIAGNOSIS — R739 Hyperglycemia, unspecified: Secondary | ICD-10-CM

## 2019-11-03 DIAGNOSIS — I10 Essential (primary) hypertension: Secondary | ICD-10-CM

## 2019-11-03 DIAGNOSIS — R918 Other nonspecific abnormal finding of lung field: Secondary | ICD-10-CM | POA: Insufficient documentation

## 2019-11-03 DIAGNOSIS — K219 Gastro-esophageal reflux disease without esophagitis: Secondary | ICD-10-CM

## 2019-11-03 DIAGNOSIS — I700 Atherosclerosis of aorta: Secondary | ICD-10-CM

## 2019-11-03 MED ORDER — VALSARTAN 80 MG PO TABS: 80.0000 mg | ORAL_TABLET | Freq: Every day | ORAL | 1 refills | Status: DC

## 2019-11-03 MED ORDER — ROSUVASTATIN CALCIUM 5 MG PO TABS: 5.0000 mg | ORAL_TABLET | Freq: Every day | ORAL | 1 refills | Status: DC

## 2019-11-03 MED ORDER — PANTOPRAZOLE SODIUM 40 MG PO TBEC: 40.0000 mg | DELAYED_RELEASE_TABLET | Freq: Every day | ORAL | 1 refills | Status: DC

## 2019-11-03 NOTE — Addendum Note (Signed)
Addended by: Alba Cory F on: 11/03/2019 03:32 PM   Modules accepted: Orders

## 2019-11-03 NOTE — Progress Notes (Signed)
Name: Aaron Ross   MRN: [id number]    DOB: 18-Sep-1955   Date:11/03/2019       Progress Note  Subjective  Chief Complaint  Chief Complaint  Patient presents with  . Hypertension    6 month recheck  . Hyperlipidemia  . Gastroesophageal Reflux  . Shortness of Breath    since positive Covid in July 2020, weak    HPI  HTN: he takes medication as prescribed and denies side effects, no chest pain or palpitation.Since we decreased dose of Diovan he denies any dizziness. Doing well on medication  Atherosclerosis of Aorta: found on CXR done 10/13/2016, he is on statin therapy and aspirin, no side effects, we will recheck labs    Hyperlipidemia: taking crestor and denies side effects of medications. Last LDLwas at goal.We will recheck it today   ED: he isdoing well with Viagra , helps to start and maintain an erectionHe does not need refill at this time  GERD: he has been taking pantoprazole for a long time , discussed side effects of medication including colitis, heart disease or osteoporosis.He states symptoms controlled with medication, he states if he skipsone day he notices severe symptoms, he states no symptoms while on medication  Hyperglycemia: he denies polyphagia, polyuria or polydipsia, last hgbA1C was 5.6%., we will recheck it yearly   Emphysema: long history of tobacco use and emphysematous change on CXR done 10/11/2017. He quit smoking 06/2017, he denies wheezing,or cough, walk his dog for 30 minutes without problems. He states he bought a CPAP machine online and is breathing better, we will get home health to check oxygen at night Pressure of 8 cmH20. Never had a sleep study. Discussed importance of seeing pulmonologist and further evaluation of sob that is not associated with chest pain or diaphoresis. He states already met deductible for this year, so he will go if able to be seen before June 1st   Patient Active Problem List   Diagnosis Date Noted  .  Atherosclerosis of aorta ([place]) 10/23/2017  . History of pneumothorax 10/11/2016  . Hyperlipidemia 11/29/2015  . GERD (gastroesophageal reflux disease) 11/29/2015  . HTN (hypertension) 01/11/2015  . COPD (chronic obstructive pulmonary disease) ([place]) 01/11/2015  . Erectile dysfunction 01/11/2015  . Hyperglycemia 01/11/2015    Past Surgical History:  Procedure Laterality Date  . COSMETIC SURGERY    . EYE SURGERY    . HERNIA REPAIR      Family History  Problem Relation Age of Onset  . Hypertension Father   . Prostate cancer Neg Hx   . Bladder Cancer Neg Hx   . Kidney cancer Neg Hx     Social History   Tobacco Use  . Smoking status: Former Smoker    Packs/day: 2.00    Years: 43.00    Pack years: 86.00    Types: Cigarettes    Start date: 06/25/1972    Quit date: 07/15/2017    Years since quitting: 2.3  . Smokeless tobacco: Never Used  Substance Use Topics  . Alcohol use: Yes    Alcohol/week: 8.0 standard drinks    Types: 8 Standard drinks or equivalent per week     Current Outpatient Medications:  .  aspirin 81 MG EC tablet, TAKE 1 TABLET BY MOUTH EVERY DAY, Disp: 30 tablet, Rfl: 0 .  pantoprazole (PROTONIX) 40 MG tablet, Take 1 tablet (40 mg total) by mouth daily., Disp: 90 tablet, Rfl: 1 .  rosuvastatin (CRESTOR) 5 MG tablet, Take 1 tablet (5 mg  total) by mouth daily., Disp: 90 tablet, Rfl: 1 .  sildenafil (VIAGRA) 100 MG tablet, Take 1 tablet (100 mg total) by mouth daily as needed for erectile dysfunction., Disp: 30 tablet, Rfl: 0 .  valsartan (DIOVAN) 80 MG tablet, Take 1 tablet (80 mg total) by mouth daily., Disp: 30 tablet, Rfl: 0  No Known Allergies  I personally reviewed active problem list, medication list, allergies, family history, social history, health maintenance with the patient/caregiver today.   ROS  Constitutional: Negative for fever or weight change.  Respiratory: Negative for cough and shortness of breath.   Cardiovascular: Negative for chest  pain or palpitations.  Gastrointestinal: Negative for abdominal pain, no bowel changes.  Musculoskeletal: Negative for gait problem or joint swelling.  Skin: Negative for rash.  Neurological: Negative for dizziness or headache.  No other specific complaints in a complete review of systems (except as listed in HPI above).  Objective  Vitals:   11/03/19 0738  BP: 132/84  Pulse: 64  Resp: 18  Temp: 97.7 F (36.5 C)  TempSrc: Temporal  SpO2: 98%  Weight: 171 lb 12.8 oz (77.9 kg)  Height: 6' (1.829 m)    Body mass index is 23.3 kg/m.  Physical Exam  Constitutional: Patient appears well-developed and well-nourished. No distress.  HEENT: head atraumatic, normocephalic, pupils equal and reactive to light Cardiovascular: Normal rate, regular rhythm and normal heart sounds.  No murmur heard. No BLE edema. Pulmonary/Chest: Effort normal and breath sounds normal. No respiratory distress. Abdominal: Soft.  There is no tenderness. Psychiatric: Patient has a normal mood and affect. behavior is normal. Judgment and thought content normal.  PHQ2/9: Depression screen [facility] 2/9 11/03/2019 08/11/2019 05/06/2019 10/28/2018 04/29/2018  Decreased Interest 0 0 0 0 0  Down, Depressed, Hopeless 0 0 0 0 0  PHQ - 2 Score 0 0 0 0 0  Altered sleeping 0 0 0 1 0  Tired, decreased energy 0 0 0 1 0  Change in appetite 0 0 0 0 0  Feeling bad or failure about yourself  0 0 0 0 0  Trouble concentrating 0 0 0 0 0  Moving slowly or fidgety/restless 0 0 0 1 0  Suicidal thoughts 0 0 0 0 0  PHQ-9 Score 0 0 0 3 0  Difficult doing work/chores Not difficult at all Not difficult at all - Not difficult at all Not difficult at all    phq 9 is negative   Fall Risk: Fall Risk  11/03/2019 08/11/2019 05/06/2019 10/28/2018 04/29/2018  Falls in the past year? 0 0 0 0 0  Number falls in past yr: 0 0 0 0 -  Injury with Fall? 0 0 0 0 -  Follow up Falls evaluation completed Falls evaluation completed - - -     Functional  Status Survey: Is the patient deaf or have difficulty hearing?: No Does the patient have difficulty seeing, even when wearing glasses/contacts?: No Does the patient have difficulty concentrating, remembering, or making decisions?: No Does the patient have difficulty walking or climbing stairs?: No Does the patient have difficulty dressing or bathing?: No Does the patient have difficulty doing errands alone such as visiting a doctor's office or shopping?: No    Assessment & Plan  1. Essential hypertension  - CBC with Differential/Platelet - COMPLETE METABOLIC PANEL WITH GFR - valsartan (DIOVAN) 80 MG tablet; Take 1 tablet (80 mg total) by mouth daily.  Dispense: 90 tablet; Refill: 1  2. Atherosclerosis of aorta (HCC)  - Lipid  panel  3. Panlobular emphysema ([place])  - Ambulatory referral to Pediatric Pulmonology  4. Lung nodule seen on imaging study  Needs repeat next year   5. Gastroesophageal reflux disease without esophagitis  - pantoprazole (PROTONIX) 40 MG tablet; Take 1 tablet (40 mg total) by mouth daily.  Dispense: 90 tablet; Refill: 1  6. Hyperglycemia  - Hemoglobin A1c  7. Pure hypercholesterolemia  - rosuvastatin (CRESTOR) 5 MG tablet; Take 1 tablet (5 mg total) by mouth daily.  Dispense: 90 tablet; Refill: 1   8. Senile purpura ([place])  Gave him reassurance both arms, takes aspirin

## 2019-11-04 ENCOUNTER — Ambulatory Visit: Payer: 59 | Admitting: Pulmonary Disease

## 2019-11-04 ENCOUNTER — Other Ambulatory Visit (HOSPITAL_COMMUNITY)
Admission: RE | Admit: 2019-11-04 | Discharge: 2019-11-04 | Disposition: A | Discharge status: Home / Self Care | Payer: 59 | Source: Ambulatory Visit | Attending: Pulmonary Disease | Admitting: Pulmonary Disease

## 2019-11-04 ENCOUNTER — Encounter: Payer: Self-pay | Admitting: Pulmonary Disease

## 2019-11-04 VITALS — BP 140/110 | HR 52 | Temp 97.0°F | Ht 73.0 in | Wt 171.0 lb

## 2019-11-04 DIAGNOSIS — R0683 Snoring: Secondary | ICD-10-CM

## 2019-11-04 DIAGNOSIS — J432 Centrilobular emphysema: Secondary | ICD-10-CM | POA: Insufficient documentation

## 2019-11-04 LAB — HEMOGLOBIN A1C
Hgb A1c MFr Bld: 5.4 % of total Hgb (ref <5.7)
Mean Plasma Glucose: 108 (calc)
eAG (mmol/L): 6 (calc)

## 2019-11-04 LAB — CBC WITH DIFFERENTIAL/PLATELET
Absolute Monocytes: 901 cells/uL (ref 200–950)
Basophils Absolute: 63 cells/uL (ref 0–200)
Basophils Relative: 1 %
Eosinophils Absolute: 202 cells/uL (ref 15–500)
Eosinophils Relative: 3.2 %
HCT: 44.5 % (ref 38.5–50.0)
Hemoglobin: 14.8 g/dL (ref 13.2–17.1)
Lymphs Abs: 2331 cells/uL (ref 850–3900)
MCH: 31.6 pg (ref 27.0–33.0)
MCHC: 33.3 g/dL (ref 32.0–36.0)
MCV: 95.1 fL (ref 80.0–100.0)
MPV: 10.5 fL (ref 7.5–12.5)
Monocytes Relative: 14.3 %
Neutro Abs: 2804 cells/uL (ref 1500–7800)
Neutrophils Relative %: 44.5 %
Platelets: 248 10*3/uL (ref 140–400)
RBC: 4.68 10*6/uL (ref 4.20–5.80)
RDW: 13 % (ref 11.0–15.0)
Total Lymphocyte: 37 %
WBC: 6.3 10*3/uL (ref 3.8–10.8)

## 2019-11-04 LAB — COMPLETE METABOLIC PANEL WITH GFR
AG Ratio: 1.7 (calc) (ref 1.0–2.5)
ALT: 55 U/L — ABNORMAL HIGH (ref 9–46)
AST: 51 U/L — ABNORMAL HIGH (ref 10–35)
Albumin: 4.4 g/dL (ref 3.6–5.1)
Alkaline phosphatase (APISO): 78 U/L (ref 35–144)
BUN: 16 mg/dL (ref 7–25)
CO2: 27 mmol/L (ref 20–32)
Calcium: 9.1 mg/dL (ref 8.6–10.3)
Chloride: 103 mmol/L (ref 98–110)
Creat: 0.86 mg/dL (ref 0.70–1.25)
GFR, Est African American: 108 mL/min/{1.73_m2} (ref >=60)
GFR, Est Non African American: 93 mL/min/{1.73_m2} (ref >=60)
Globulin: 2.6 g/dL (calc) (ref 1.9–3.7)
Glucose, Bld: 96 mg/dL (ref 65–99)
Potassium: 4.2 mmol/L (ref 3.5–5.3)
Sodium: 140 mmol/L (ref 135–146)
Total Bilirubin: 0.5 mg/dL (ref 0.2–1.2)
Total Protein: 7 g/dL (ref 6.1–8.1)

## 2019-11-04 LAB — LIPID PANEL
Cholesterol: 173 mg/dL (ref <200)
HDL: 81 mg/dL (ref >=40)
LDL Cholesterol (Calc): 79 mg/dL (calc)
Non-HDL Cholesterol (Calc): 92 mg/dL (calc) (ref <130)
Total CHOL/HDL Ratio: 2.1 (calc) (ref <5.0)
Triglycerides: 52 mg/dL (ref <150)

## 2019-11-04 NOTE — Progress Notes (Signed)
[facility]  Chief Complaint  Patient presents with  . Consult    Patient has emphysema and possible sleep apnea. Patient had lung cancer CT done in Feb and nodule was seen. Patient smoked 2-3 packs a day for many years and quit about 3 years ago. Patient snores and has trouble sleeping. He ordered a CPAP machine offline and has noticed improvment and doesn't snore anymore. Averages 6-7 hours of sleep a night.    Constitutional:  BP (!) 140/110 (BP Location: Left Arm, Patient Position: Sitting, Cuff Size: Normal)   Pulse (!) 52   Temp (!) 97 F (36.1 C) (Temporal)   Ht 6\' 1"  (1.854 m)   Wt 171 lb (77.6 kg)   SpO2 99%   BMI 22.56 kg/m   Past Medical History:  GERD, HTN, OSA  Summary:  Aaron Ross is a 64 y.o. male former smoker with COPD and emphysema.  Subjective:   He quit smoking few years ago.  He enrolled in lung cancer screening program with low dose CT chest.  This was most recently done in February 2021.  Showed stable nodules, but also centrilobular and paraseptal emphysema as well as bullous changes in upper lobes.  He gets winded if he exerts too much.  Does okay with usual activity and at rest.  Not having cough, wheeze, sputum, chest pain, hemoptysis, or leg swelling.  Hasn't used inhalers.  Feels his breathing is okay at present.  He has snoring, and would wake up feeling choked and his mouth was dry.  He would be tired during the day.  He purchased a CPAP machine on his own.  This has helped his sleep and daytime alertness.  He hasn't had a sleep study done yet.  No history of pneumonia or TB.  He thinks he had pneumonia vaccine prior to March 2021 trip.  He had COVID vaccine.  Gets flu shot annually.    Physical Exam:   Appearance - well kempt  ENMT - no sinus tenderness, no nasal discharge, no oral exudate, Mallampati 2, 2+ tonsils, low laying soft palate  Respiratory - no wheeze, or rales  CV - regular rate and  rhythm, no murmurs  GI - soft, non tender  Lymph - no adenopathy noted in neck  Ext - no edema  Skin - no rashes  Neuro - normal strength, oriented x 3  Psych - normal mood and affect   Assessment/Plan:   COPD with emphysema. - minimal symptoms at present - defer inhaler therapy for now - will check A1AT level  Snoring. - symptoms consistent with sleep apnea - will arrange for home sleep study to further assess - assuming he is found to have sleep apnea, will arrange for referral to DME to get CPAP supplies  History of tobacco abuse. - he will do f/u low dose CT chest in February 2022  A total of 48 minutes addressing patient care on the day of the visit.  Follow up:  Patient Instructions  Will schedule lab test and home sleep study, and call you with results  Follow up in 6 months   Signature:  March 2022, MD [facility] Pager: [phone] 11/04/2019, 12:44 PM  Flow Sheet     Pulmonary tests:  Spirometry 10/28/18 >> FEV1 2.92 (79%), FEV1% 62  Chest imaging:  LDCT chest 08/20/19 >> atherosclerosis, centrilobular and paraseptal emphysema, bullous changes, 10.2 mm lesion RUL stable, 14.9 mm lesion Lt costophrenic sulcus (was 17.9 mm)  Medications:   Allergies as of 11/04/2019   No Known Allergies     Medication List       Accurate as of Nov 04, 2019 12:44 PM. If you have any questions, ask your nurse or doctor.        aspirin 81 MG EC tablet TAKE 1 TABLET BY MOUTH EVERY DAY   pantoprazole 40 MG tablet Commonly known as: PROTONIX Take 1 tablet (40 mg total) by mouth daily.   rosuvastatin 5 MG tablet Commonly known as: CRESTOR Take 1 tablet (5 mg total) by mouth daily.   sildenafil 100 MG tablet Commonly known as: Viagra Take 1 tablet (100 mg total) by mouth daily as needed for erectile dysfunction.   valsartan 80 MG tablet Commonly known as: DIOVAN Take 1 tablet (80 mg total) by mouth daily.       Past Surgical  History:  He  has a past surgical history that includes Hernia repair; Cosmetic surgery; and Eye surgery.  Family History:  His family history includes Hypertension in his father.  Social History:  He  reports that he quit smoking about 2 years ago. His smoking use included cigarettes. He started smoking about 47 years ago. He has a 86.00 pack-year smoking history. He has never used smokeless tobacco. He reports current alcohol use of about 8.0 standard drinks of alcohol per week. He reports that he does not use drugs.

## 2019-11-04 NOTE — Patient Instructions (Signed)
Will schedule lab test and home sleep study, and call you with results  Follow up in 6 months

## 2019-11-09 LAB — ALPHA-1 ANTITRYPSIN PHENOTYPE: A-1 Antitrypsin, Ser: 113 mg/dL (ref 101–187)

## 2019-11-10 ENCOUNTER — Encounter: Payer: Self-pay | Admitting: Family Medicine

## 2019-11-10 ENCOUNTER — Telehealth: Payer: Self-pay | Admitting: Pulmonary Disease

## 2019-11-10 ENCOUNTER — Other Ambulatory Visit: Payer: Self-pay | Admitting: Family Medicine

## 2019-11-10 NOTE — Telephone Encounter (Signed)
A1AT 11/04/19 >> 113, MM   Please let him know lab test is normal.  He doesn't have inherited form of emphysema.

## 2019-11-10 NOTE — Telephone Encounter (Signed)
   Please let him know lab test is normal.  He doesn't have inherited form of emphysema.  Advised pt of results. Pt understood and nothing further is needed.

## 2019-11-11 NOTE — Telephone Encounter (Signed)
Called and left message for patient to return call regarding recent results.

## 2019-11-18 ENCOUNTER — Encounter: Payer: Self-pay | Admitting: Family Medicine

## 2019-12-13 ENCOUNTER — Encounter: Payer: Self-pay | Admitting: Family Medicine

## 2019-12-14 ENCOUNTER — Other Ambulatory Visit: Payer: Self-pay | Admitting: Family Medicine

## 2019-12-14 DIAGNOSIS — I10 Essential (primary) hypertension: Secondary | ICD-10-CM

## 2019-12-14 MED ORDER — VALSARTAN 160 MG PO TABS: 160.0000 mg | ORAL_TABLET | Freq: Every day | ORAL | 1 refills | Status: DC

## 2020-01-06 ENCOUNTER — Other Ambulatory Visit: Payer: Self-pay

## 2020-01-06 ENCOUNTER — Ambulatory Visit: Payer: BC Managed Care – PPO

## 2020-01-06 DIAGNOSIS — G4733 Obstructive sleep apnea (adult) (pediatric): Secondary | ICD-10-CM | POA: Diagnosis not present

## 2020-01-06 DIAGNOSIS — R0683 Snoring: Secondary | ICD-10-CM

## 2020-01-08 ENCOUNTER — Telehealth: Payer: Self-pay | Admitting: Pulmonary Disease

## 2020-01-08 DIAGNOSIS — G4733 Obstructive sleep apnea (adult) (pediatric): Secondary | ICD-10-CM | POA: Diagnosis not present

## 2020-01-08 NOTE — Telephone Encounter (Signed)
Called and spoke with pt letting him know the HST results stated by VS and pt verbalized understanding. Pt has been scheduled an appt with VS 7/22 to further discuss results and tx options. Nothing further needed.

## 2020-01-08 NOTE — Telephone Encounter (Signed)
HST 01/06/20 >> AHI 33.1, SpO2 low 80%.   Please inform him that his sleep study shows severe obstructive sleep apnea.  Please arrange for ROV with me or NP to discuss treatment options.

## 2020-01-14 ENCOUNTER — Encounter: Payer: Self-pay | Admitting: Pulmonary Disease

## 2020-01-14 ENCOUNTER — Ambulatory Visit: Payer: BC Managed Care – PPO | Admitting: Pulmonary Disease

## 2020-01-14 ENCOUNTER — Other Ambulatory Visit: Payer: Self-pay

## 2020-01-14 VITALS — BP 138/90 | HR 78 | Temp 97.5°F | Ht 73.0 in | Wt 169.0 lb

## 2020-01-14 DIAGNOSIS — Z9989 Dependence on other enabling machines and devices: Secondary | ICD-10-CM

## 2020-01-14 DIAGNOSIS — G4733 Obstructive sleep apnea (adult) (pediatric): Secondary | ICD-10-CM

## 2020-01-14 DIAGNOSIS — J432 Centrilobular emphysema: Secondary | ICD-10-CM | POA: Diagnosis not present

## 2020-01-14 NOTE — Patient Instructions (Signed)
Will arrange for medical supply company to keep up with CPAP equipment and have then adjust your CPAP to 6 cm water pressure  Email if you are having trouble with CPAP after pressure adjusted  Follow up in March 2022

## 2020-01-14 NOTE — Progress Notes (Signed)
[facility]  Chief Complaint  Patient presents with  . Follow-up    HST 01/06/2020--currently wearing cpap avg 7hr nightly- feels pressure is too strong.     Constitutional:  BP (!) 138/90 (BP Location: Left Arm, Cuff Size: Normal)   Pulse 78   Temp (!) 97.5 F (36.4 C) (Temporal)   Ht 6\' 1"  (1.854 m)   Wt 169 lb (76.7 kg)   SpO2 95%   BMI 22.30 kg/m   Past Medical History:  GERD, HTN, OSA  Summary:  Aaron Ross is a 64 y.o. male former smoker with COPD from emphysema, and obstructive sleep apnea.  Subjective:  He had home sleep study from July 14.  Showed severe obstructive sleep apnea.  Alpha 1 antitrypsin level was normal.  CPAP download reviewed.  He has been using CPAP 8 cm H2O.  Feels pressure is too high.   Physical Exam:   Appearance - well kempt   ENMT - no sinus tenderness, no oral exudate, no LAN, Mallampati 2 airway, no stridor, low laying soft palate  Respiratory - equal breath sounds bilaterally, no wheezing or rales  CV - s1s2 regular rate and rhythm, no murmurs  Ext - no clubbing, no edema  Skin - no rashes  Psych - normal mood and affect   Assessment/Plan:   COPD with emphysema. - minimal symptoms at present - defer inhaler therapy for now  Obstructive sleep apnea. - reviewed sleep study results with him - discussed how untreated sleep apnea can impact his health - reviewed treatment options - will arrange for DME to get CPAP supplies - will adjust his CPAP from 8 to 6 cm H2O  History of tobacco abuse. - he will do f/u low dose CT chest in February 2022   Follow up:  Patient Instructions  Will arrange for medical supply company to keep up with CPAP equipment and have then adjust your CPAP to 6 cm water pressure  Email if you are having trouble with CPAP after pressure adjusted  Follow up in March 2022   Signature:  April 2022, MD [facility] Pager:  [phone] 01/14/2020, 12:07 PM  Flow Sheet     Pulmonary tests:   Spirometry 10/28/18 >> FEV1 2.92 (79%), FEV1% 62  A1AT 11/04/19 >> 113, MM  Chest imaging:   LDCT chest 08/20/19 >> atherosclerosis, centrilobular and paraseptal emphysema, bullous changes, 10.2 mm lesion RUL stable, 14.9 mm lesion Lt costophrenic sulcus (was 17.9 mm)  Sleep testing:   HST 01/06/20 >> AHI 33.1, SpO2 low 80%.  Medications:   Allergies as of 01/14/2020   No Known Allergies     Medication List       Accurate as of January 14, 2020 12:07 PM. If you have any questions, ask your nurse or doctor.        aspirin 81 MG EC tablet TAKE 1 TABLET BY MOUTH EVERY DAY   pantoprazole 40 MG tablet Commonly known as: PROTONIX Take 1 tablet (40 mg total) by mouth daily.   rosuvastatin 5 MG tablet Commonly known as: CRESTOR Take 1 tablet (5 mg total) by mouth daily.   sildenafil 100 MG tablet Commonly known as: Viagra Take 1 tablet (100 mg total) by mouth daily as needed for erectile dysfunction.   valsartan 160 MG tablet Commonly known as: DIOVAN Take 1 tablet (160 mg total) by mouth daily.       Past Surgical History:  He  has a past surgical  history that includes Hernia repair; Cosmetic surgery; and Eye surgery.  Family History:  His family history includes Hypertension in his father.  Social History:  He  reports that he quit smoking about 2 years ago. His smoking use included cigarettes. He started smoking about 47 years ago. He has a 86.00 pack-year smoking history. He has never used smokeless tobacco. He reports current alcohol use of about 8.0 standard drinks of alcohol per week. He reports that he does not use drugs.

## 2020-01-17 ENCOUNTER — Other Ambulatory Visit: Payer: Self-pay | Admitting: Family Medicine

## 2020-01-17 DIAGNOSIS — I10 Essential (primary) hypertension: Secondary | ICD-10-CM

## 2020-01-24 ENCOUNTER — Encounter: Payer: Self-pay | Admitting: Family Medicine

## 2020-01-24 DIAGNOSIS — E7800 Pure hypercholesterolemia, unspecified: Secondary | ICD-10-CM

## 2020-01-25 MED ORDER — ROSUVASTATIN CALCIUM 5 MG PO TABS: 5.0000 mg | ORAL_TABLET | Freq: Every day | ORAL | 1 refills | Status: DC

## 2020-02-04 DIAGNOSIS — G4733 Obstructive sleep apnea (adult) (pediatric): Secondary | ICD-10-CM | POA: Diagnosis not present

## 2020-03-16 ENCOUNTER — Encounter: Payer: 59 | Admitting: Dermatology

## 2020-03-19 DIAGNOSIS — Z23 Encounter for immunization: Secondary | ICD-10-CM | POA: Diagnosis not present

## 2020-04-11 DIAGNOSIS — Z872 Personal history of diseases of the skin and subcutaneous tissue: Secondary | ICD-10-CM | POA: Diagnosis not present

## 2020-04-11 DIAGNOSIS — L570 Actinic keratosis: Secondary | ICD-10-CM | POA: Diagnosis not present

## 2020-04-11 DIAGNOSIS — L578 Other skin changes due to chronic exposure to nonionizing radiation: Secondary | ICD-10-CM | POA: Diagnosis not present

## 2020-04-11 DIAGNOSIS — L821 Other seborrheic keratosis: Secondary | ICD-10-CM | POA: Diagnosis not present

## 2020-05-06 ENCOUNTER — Encounter: Payer: 59 | Admitting: Family Medicine

## 2020-05-09 NOTE — Progress Notes (Signed)
Name: Aaron Ross   MRN: [id number]    DOB: 03/07/1956   Date:05/10/2020       Progress Note  Subjective  Chief Complaint  Annual exam  HPI  Patient presents for annual CPE and follow up  HTN: he takes medication as prescribed and denies side effects, no chest pain or palpitation.BP today is at goal, continue current regiment   Varicose veins : both legs, but worse on right leg   OSA: under the care of pulmonologist Dr. Craige CottaSood, and is wearing CPAP machine every night pressure of 6 cm H2O , he states he does not noticed any improvement in energy or sleep pattern but his wife has noticed that he does not snore and is really happy about it  Atherosclerosis of Aorta: found on CXR done 10/13/2016, he is on statin therapy and aspirin, no side effects  Hyperlipidemia: taking crestor and denies side effects of medications. Discussed going up on dose since LDL above 70 and he is willing to try it, last liver enzymes was slightly elevated, he drinks 6 days a week, one glass of boubron with ice 3 oz, he skips drinking once a week.   ED: he isdoing well with Viagra , helps to start and maintain an erectionHe does not need refill at this time, he will call back when he needs a refill   GERD: he has been taking pantoprazole for a long time , discussed side effects of medication including colitis, heart disease or osteoporosis.He states symptoms controlled with medication, he states if he skipsone day he notices severe symptoms, he wants to continue medication   Hyperglycemia: he denies polyphagia, polyuria or polydipsia, last hgbA1C was down to 5.4 %    Emphysema: long history of tobacco use and emphysematous change on CXR done 10/11/2017. He quit smoking 06/2017, he denies wheezing,or cough, walks his dog for 20 minutes and noticed some sob when he walks fast .Discussed starting an inhaler but he wants to hold off for now    IPSS Questionnaire (AUA-7): Over the past month   1)   How often have you had a sensation of not emptying your bladder completely after you finish urinating?  0- Not at all  2)  How often have you had to urinate again less than two hours after you finished urinating? 0- Not at all  3)  How often have you found you stopped and started again several times when you urinated?  0- Not at all  4) How difficult have you found it to postpone urination?  0- Not at all  5) How often have you had a weak urinary stream?  0- Not at all  6) How often have you had to push or strain to begin urination?  0- Not at all  7) How many times did you most typically get up to urinate from the time you went to bed until the time you got up in the morning?  1- One time  Total score:  0-7 mildly symptomatic   8-19 moderately symptomatic   20-35 severely symptomatic     Diet: balanced, eating at home, needs more calcium  Exercise: continue regular physical activity   Depression: phq 9 is negative [facility] 05/10/2020 05/10/2020 11/03/2019 08/11/2019 05/06/2019  Decreased Interest 0 0 0 0 0  Down, Depressed, Hopeless 0 0 0 0 0  PHQ - 2 Score 0 0 0 0 0  Altered sleeping 1 - 0 0 0  Tired, decreased energy 0 -  0 0 0  Change in appetite 1 - 0 0 0  Feeling bad or failure about yourself  0 - 0 0 0  Trouble concentrating 0 - 0 0 0  Moving slowly or fidgety/restless 0 - 0 0 0  Suicidal thoughts 0 - 0 0 0  PHQ-9 Score 2 - 0 0 0  Difficult doing work/chores Not difficult at all - Not difficult at all Not difficult at all -  Some recent data might be hidden    Hypertension:  BP Readings from Last 3 Encounters:  05/10/20 136/82  01/14/20 (!) 138/90  11/04/19 (!) 140/110    Obesity: Wt Readings from Last 3 Encounters:  05/10/20 164 lb 8 oz (74.6 kg)  01/14/20 169 lb (76.7 kg)  11/04/19 171 lb (77.6 kg)   BMI Readings from Last 3 Encounters:  05/10/20 21.70 kg/m  01/14/20 22.30 kg/m  11/04/19 22.56 kg/m     Lipids:  Lab Results  Component  Value Date   CHOL 173 11/03/2019   CHOL 165 10/28/2018   CHOL 161 10/23/2017   Lab Results  Component Value Date   HDL 81 11/03/2019   HDL 63 10/28/2018   HDL 64 10/23/2017   Lab Results  Component Value Date   LDLCALC 79 11/03/2019   LDLCALC 77 10/28/2018   LDLCALC 72 10/23/2017   Lab Results  Component Value Date   TRIG 52 11/03/2019   TRIG 149 10/28/2018   TRIG 169 (H) 10/23/2017   Lab Results  Component Value Date   CHOLHDL 2.1 11/03/2019   CHOLHDL 2.6 10/28/2018   CHOLHDL 2.5 10/23/2017   No results found for: LDLDIRECT Glucose:  Glucose, Bld  Date Value Ref Range Status  11/03/2019 96 65 - 99 mg/dL Final    Comment:    .            Fasting reference interval .   10/28/2018 112 (H) 65 - 99 mg/dL Final    Comment:    .            Fasting reference interval . For someone without known diabetes, a glucose value between 100 and 125 mg/dL is consistent with prediabetes and should be confirmed with a follow-up test. .   10/23/2017 96 65 - 139 mg/dL Final    Comment:    .        Non-fasting reference interval .    Glucose-Capillary  Date Value Ref Range Status  06/04/2019 78 70 - 99 mg/dL Final      [facility] Visit from 05/10/2020 in [facility]  AUDIT-C Score 6       Married STD testing and prevention (HIV/chl/gon/syphilis): not interested  Hep C: 10/23/2017  Skin cancer: Discussed monitoring for atypical lesions Colorectal cancer: up to date, repeat next year  Prostate cancer: no family history, discussed USPTF    Lung cancer:  Low Dose CT Chest recommended if Age 68-80 years, 30 pack-year currently smoking OR have quit w/in 15years. Patient does not qualify.   ECG:  10/14/2016  Vaccines:   Shingrix: up to date Pneumonia:  educated and discussed with patient. Flu:  educated and discussed with patient.  Advanced Care Planning: A voluntary discussion about advance care planning including the explanation and discussion  of advance directives.  Discussed health care proxy and Living will, and the patient was able to identify a health care proxy as wife .    Patient Active Problem List   Diagnosis Date Noted  Senile purpura (HCC) 11/03/2019   Lung nodules 11/03/2019   Atherosclerosis of aorta (HCC) 10/23/2017   History of pneumothorax 10/11/2016   Hyperlipidemia 11/29/2015   GERD (gastroesophageal reflux disease) 11/29/2015   HTN (hypertension) 01/11/2015   COPD (chronic obstructive pulmonary disease) (HCC) 01/11/2015   Erectile dysfunction 01/11/2015   Hyperglycemia 01/11/2015    Past Surgical History:  Procedure Laterality Date   COSMETIC SURGERY     EYE SURGERY     HERNIA REPAIR      Family History  Problem Relation Age of Onset   Hypertension Father    Prostate cancer Neg Hx    Bladder Cancer Neg Hx    Kidney cancer Neg Hx     Social History   Socioeconomic History   Marital status: Married    Spouse name: Not on file   Number of children: 0   Years of education: Not on file   Highest education level: 9th grade  Occupational History   Occupation: Customer service manager   Tobacco Use   Smoking status: Former Smoker    Packs/day: 2.00    Years: 43.00    Pack years: 86.00    Types: Cigarettes    Start date: 06/25/1972    Quit date: 07/15/2017    Years since quitting: 2.8   Smokeless tobacco: Never Used  Vaping Use   Vaping Use: Never used  Substance and Sexual Activity   Alcohol use: Yes    Alcohol/week: 8.0 standard drinks    Types: 8 Standard drinks or equivalent per week   Drug use: No   Sexual activity: Yes    Partners: Female    Birth control/protection: Surgical  Other Topics Concern   Not on file  Social History Narrative   Divorced, never had children, remarried January 2020, she is from [place]    He works full time in [place], Press photographer   He is originally [place] Moved here with parents and siblings when he was 16.    Siblings still  in [place]   Social Determinants of Health   Financial Resource Strain: Low Risk    Difficulty of Paying Living Expenses: Not hard at all  Food Insecurity: No Food Insecurity   Worried About Programme researcher, broadcasting/film/video in the Last Year: Never true   Barista in the Last Year: Never true  Transportation Needs: No Transportation Needs   Lack of Transportation (Medical): No   Lack of Transportation (Non-Medical): No  Physical Activity: Insufficiently Active   Days of Exercise per Week: 5 days   Minutes of Exercise per Session: 20 min  Stress: Stress Concern Present   Feeling of Stress : To some extent  Social Connections: Moderately Isolated   Frequency of Communication with Friends and Family: Twice a week   Frequency of Social Gatherings with Friends and Family: Once a week   Attends Religious Services: Never   Database administrator or Organizations: No   Attends Engineer, structural: Never   Marital Status: Married  Catering manager Violence: Not At Risk   Fear of Current or Ex-Partner: No   Emotionally Abused: No   Physically Abused: No   Sexually Abused: No     Current Outpatient Medications:    aspirin 81 MG EC tablet, TAKE 1 TABLET BY MOUTH EVERY DAY, Disp: 30 tablet, Rfl: 0   pantoprazole (PROTONIX) 40 MG tablet, Take 1 tablet (40 mg total) by mouth daily., Disp: 90 tablet, Rfl: 1   rosuvastatin (  CRESTOR) 5 MG tablet, Take 1 tablet (5 mg total) by mouth daily., Disp: 90 tablet, Rfl: 1   sildenafil (VIAGRA) 100 MG tablet, Take 1 tablet (100 mg total) by mouth daily as needed for erectile dysfunction., Disp: 30 tablet, Rfl: 0   valsartan (DIOVAN) 160 MG tablet, Take 1 tablet (160 mg total) by mouth daily., Disp: 90 tablet, Rfl: 1  No Known Allergies   ROS  Constitutional: Negative for fever or weight change.  Respiratory: Negative for cough and shortness of breath.   Cardiovascular: Negative for chest pain or palpitations.   Gastrointestinal: Negative for abdominal pain, no bowel changes.  Musculoskeletal: Negative for gait problem or joint swelling.  Skin: Negative for rash.  Neurological: Negative for dizziness or headache.  No other specific complaints in a complete review of systems (except as listed in HPI above).  Objective  Vitals:   05/10/20 0904  BP: 136/82  Pulse: 76  Resp: 16  Temp: 98 F (36.7 C)  TempSrc: Oral  SpO2: 98%  Weight: 164 lb 8 oz (74.6 kg)  Height: 6\' 1"  (1.854 m)    Body mass index is 21.7 kg/m.  Physical Exam  Constitutional: Patient appears well-developed and well-nourished. No distress.  HENT: Head: Normocephalic and atraumatic. Ears: B TMs ok, no erythema or effusion; Nose: Not done Mouth/Throat: not done  Eyes: Conjunctivae and EOM are normal. Pupils are equal, round, and reactive to light. No scleral icterus.  Neck: Normal range of motion. Neck supple. No JVD present. No thyromegaly present.  Cardiovascular: Normal rate, regular rhythm and normal heart sounds.  No murmur heard. No BLE edema. Pulmonary/Chest: Effort normal and breath sounds normal. No respiratory distress. Abdominal: Soft. Bowel sounds are normal, no distension. There is no tenderness. no masses MALE GENITALIA: Normal descended testes bilaterally, no masses palpated, no hernias, no lesions, no discharge RECTAL: Prostate normal size and consistency, no rectal masses or hemorrhoids  Musculoskeletal: Normal range of motion, no joint effusions. No gross deformities Neurological: he is alert and oriented to person, place, and time. No cranial nerve deficit. Coordination, balance, strength, speech and gait are normal.  Skin: Skin is warm and dry. No rash noted. No erythema.  Psychiatric: Patient has a normal mood and affect. behavior is normal. Judgment and thought content normal.  Fall Risk: Fall Risk  05/10/2020 11/03/2019 08/11/2019 05/06/2019 10/28/2018  Falls in the past year? 0 0 0 0 0  Number falls  in past yr: 0 0 0 0 0  Injury with Fall? 0 0 0 0 0  Follow up - Falls evaluation completed Falls evaluation completed - -     Functional Status Survey: Is the patient deaf or have difficulty hearing?: No Does the patient have difficulty seeing, even when wearing glasses/contacts?: No Does the patient have difficulty concentrating, remembering, or making decisions?: Yes (Remembering) Does the patient have difficulty walking or climbing stairs?: No Does the patient have difficulty dressing or bathing?: No Does the patient have difficulty doing errands alone such as visiting a doctor's office or shopping?: No    Assessment & Plan  1. Pure hypercholesterolemia  - rosuvastatin (CRESTOR) 10 MG tablet; Take 1 tablet (10 mg total) by mouth daily.  Dispense: 90 tablet; Refill: 1 - Lipid panel - COMPLETE METABOLIC PANEL WITH GFR  2. Gastroesophageal reflux disease without esophagitis  - pantoprazole (PROTONIX) 40 MG tablet; Take 1 tablet (40 mg total) by mouth daily.  Dispense: 90 tablet; Refill: 1  3. Essential hypertension  - valsartan (  DIOVAN) 160 MG tablet; Take 1 tablet (160 mg total) by mouth daily.  Dispense: 90 tablet; Refill: 1 - COMPLETE METABOLIC PANEL WITH GFR  4. Well adult exam  - Cholecalciferol (VITAMIN D3) 50 MCG (2000 UT) CAPS; Take 1 capsule (2,000 Units total) by mouth daily.  Dispense: 100 capsule; Refill: 0  5. Need for diphtheria-tetanus-pertussis (Tdap) vaccine  - Tdap vaccine greater than or equal to 7yo IM  6. Hyperglycemia   7. Senile purpura (HCC)  Reassurance given   8. Atherosclerosis of aorta (HCC)   9. Panlobular emphysema (HCC)   10. Other male erectile dysfunction  Doing well on prn Viagra    -Prostate cancer screening and PSA options (with potential risks and benefits of testing vs not testing) were discussed along with recent recs/guidelines. -USPSTF grade A and B recommendations reviewed with patient; age-appropriate  recommendations, preventive care, screening tests, etc discussed and encouraged; healthy living encouraged; see AVS for patient education given to patient -Discussed importance of 150 minutes of physical activity weekly, eat two servings of fish weekly, eat one serving of tree nuts ( cashews, pistachios, pecans, almonds.Marland Kitchen) every other day, eat 6 servings of fruit/vegetables daily and drink plenty of water and avoid sweet beverages.

## 2020-05-09 NOTE — Patient Instructions (Signed)
Preventive Care 41-63 Years Old, Male Preventive care refers to lifestyle choices and visits with your health care provider that can promote health and wellness. This includes:  A yearly physical exam. This is also called an annual well check.  Regular dental and eye exams.  Immunizations.  Screening for certain conditions.  Healthy lifestyle choices, such as eating a healthy diet, getting regular exercise, not using drugs or products that contain nicotine and tobacco, and limiting alcohol use. What can I expect for my preventive care visit? Physical exam Your health care provider will check:  Height and weight. These may be used to calculate body mass index (BMI), which is a measurement that tells if you are at a healthy weight.  Heart rate and blood pressure.  Your skin for abnormal spots. Counseling Your health care provider may ask you questions about:  Alcohol, tobacco, and drug use.  Emotional well-being.  Home and relationship well-being.  Sexual activity.  Eating habits.  Work and work Statistician. What immunizations do I need?  Influenza (flu) vaccine  This is recommended every year. Tetanus, diphtheria, and pertussis (Tdap) vaccine  You may need a Td booster every 10 years. Varicella (chickenpox) vaccine  You may need this vaccine if you have not already been vaccinated. Zoster (shingles) vaccine  You may need this after age 64. Measles, mumps, and rubella (MMR) vaccine  You may need at least one dose of MMR if you were born in 1957 or later. You may also need a second dose. Pneumococcal conjugate (PCV13) vaccine  You may need this if you have certain conditions and were not previously vaccinated. Pneumococcal polysaccharide (PPSV23) vaccine  You may need one or two doses if you smoke cigarettes or if you have certain conditions. Meningococcal conjugate (MenACWY) vaccine  You may need this if you have certain conditions. Hepatitis A  vaccine  You may need this if you have certain conditions or if you travel or work in places where you may be exposed to hepatitis A. Hepatitis B vaccine  You may need this if you have certain conditions or if you travel or work in places where you may be exposed to hepatitis B. Haemophilus influenzae type b (Hib) vaccine  You may need this if you have certain risk factors. Human papillomavirus (HPV) vaccine  If recommended by your health care provider, you may need three doses over 6 months. You may receive vaccines as individual doses or as more than one vaccine together in one shot (combination vaccines). Talk with your health care provider about the risks and benefits of combination vaccines. What tests do I need? Blood tests  Lipid and cholesterol levels. These may be checked every 5 years, or more frequently if you are over 60 years old.  Hepatitis C test.  Hepatitis B test. Screening  Lung cancer screening. You may have this screening every year starting at age 43 if you have a 30-pack-year history of smoking and currently smoke or have quit within the past 15 years.  Prostate cancer screening. Recommendations will vary depending on your family history and other risks.  Colorectal cancer screening. All adults should have this screening starting at age 72 and continuing until age 2. Your health care provider may recommend screening at age 14 if you are at increased risk. You will have tests every 1-10 years, depending on your results and the type of screening test.  Diabetes screening. This is done by checking your blood sugar (glucose) after you have not eaten  for a while (fasting). You may have this done every 1-3 years.  Sexually transmitted disease (STD) testing. Follow these instructions at home: Eating and drinking  Eat a diet that includes fresh fruits and vegetables, whole grains, lean protein, and low-fat dairy products.  Take vitamin and mineral supplements as  recommended by your health care provider.  Do not drink alcohol if your health care provider tells you not to drink.  If you drink alcohol: ? Limit how much you have to 0-2 drinks a day. ? Be aware of how much alcohol is in your drink. In the [place] one drink equals one 12 oz bottle of beer (355 mL), one 5 oz glass of wine (148 mL), or one 1 oz glass of hard liquor (44 mL). Lifestyle  Take daily care of your teeth and gums.  Stay active. Exercise for at least 30 minutes on 5 or more days each week.  Do not use any products that contain nicotine or tobacco, such as cigarettes, e-cigarettes, and chewing tobacco. If you need help quitting, ask your health care provider.  If you are sexually active, practice safe sex. Use a condom or other form of protection to prevent STIs (sexually transmitted infections).  Talk with your health care provider about taking a low-dose aspirin every day starting at age 53. What's next?  Go to your health care provider once a year for a well check visit.  Ask your health care provider how often you should have your eyes and teeth checked.  Stay up to date on all vaccines. This information is not intended to replace advice given to you by your health care provider. Make sure you discuss any questions you have with your health care provider. Document Revised: 06/05/2018 Document Reviewed: 06/05/2018 Elsevier Patient Education  2020 [employer].

## 2020-05-10 ENCOUNTER — Ambulatory Visit (INDEPENDENT_AMBULATORY_CARE_PROVIDER_SITE_OTHER): Payer: BC Managed Care – PPO | Admitting: Family Medicine

## 2020-05-10 ENCOUNTER — Encounter: Payer: Self-pay | Admitting: Family Medicine

## 2020-05-10 ENCOUNTER — Other Ambulatory Visit: Payer: Self-pay

## 2020-05-10 VITALS — BP 136/82 | HR 76 | Temp 98.0°F | Resp 16 | Ht 73.0 in | Wt 164.5 lb

## 2020-05-10 DIAGNOSIS — Z0000 Encounter for general adult medical examination without abnormal findings: Secondary | ICD-10-CM

## 2020-05-10 DIAGNOSIS — E7800 Pure hypercholesterolemia, unspecified: Secondary | ICD-10-CM

## 2020-05-10 DIAGNOSIS — N528 Other male erectile dysfunction: Secondary | ICD-10-CM

## 2020-05-10 DIAGNOSIS — Z23 Encounter for immunization: Secondary | ICD-10-CM

## 2020-05-10 DIAGNOSIS — J431 Panlobular emphysema: Secondary | ICD-10-CM

## 2020-05-10 DIAGNOSIS — K219 Gastro-esophageal reflux disease without esophagitis: Secondary | ICD-10-CM

## 2020-05-10 DIAGNOSIS — D692 Other nonthrombocytopenic purpura: Secondary | ICD-10-CM | POA: Diagnosis not present

## 2020-05-10 DIAGNOSIS — I10 Essential (primary) hypertension: Secondary | ICD-10-CM

## 2020-05-10 DIAGNOSIS — R739 Hyperglycemia, unspecified: Secondary | ICD-10-CM

## 2020-05-10 DIAGNOSIS — I700 Atherosclerosis of aorta: Secondary | ICD-10-CM

## 2020-05-10 MED ORDER — PANTOPRAZOLE SODIUM 40 MG PO TBEC: 40.0000 mg | DELAYED_RELEASE_TABLET | Freq: Every day | ORAL | 1 refills | Status: DC

## 2020-05-10 MED ORDER — ROSUVASTATIN CALCIUM 10 MG PO TABS: 10.0000 mg | ORAL_TABLET | Freq: Every day | ORAL | 1 refills | Status: DC

## 2020-05-10 MED ORDER — VITAMIN D (CHOLECALCIFEROL) 50 MCG (2000 UT) PO CAPS: 1.0000 | ORAL_CAPSULE | Freq: Every day | ORAL | 0 refills | Status: DC

## 2020-05-10 MED ORDER — VALSARTAN 160 MG PO TABS: 160.0000 mg | ORAL_TABLET | Freq: Every day | ORAL | 1 refills | Status: DC

## 2020-05-11 LAB — COMPLETE METABOLIC PANEL WITH GFR
AG Ratio: 1.7 (calc) (ref 1.0–2.5)
ALT: 41 U/L (ref 9–46)
AST: 37 U/L — ABNORMAL HIGH (ref 10–35)
Albumin: 4.7 g/dL (ref 3.6–5.1)
Alkaline phosphatase (APISO): 76 U/L (ref 35–144)
BUN: 18 mg/dL (ref 7–25)
CO2: 27 mmol/L (ref 20–32)
Calcium: 9.9 mg/dL (ref 8.6–10.3)
Chloride: 103 mmol/L (ref 98–110)
Creat: 1.05 mg/dL (ref 0.70–1.25)
GFR, Est African American: 87 mL/min/{1.73_m2} (ref >=60)
GFR, Est Non African American: 75 mL/min/{1.73_m2} (ref >=60)
Globulin: 2.7 g/dL (calc) (ref 1.9–3.7)
Glucose, Bld: 91 mg/dL (ref 65–99)
Potassium: 4.9 mmol/L (ref 3.5–5.3)
Sodium: 141 mmol/L (ref 135–146)
Total Bilirubin: 0.4 mg/dL (ref 0.2–1.2)
Total Protein: 7.4 g/dL (ref 6.1–8.1)

## 2020-05-11 LAB — LIPID PANEL
Cholesterol: 191 mg/dL (ref <200)
HDL: 76 mg/dL (ref >=40)
LDL Cholesterol (Calc): 88 mg/dL (calc)
Non-HDL Cholesterol (Calc): 115 mg/dL (calc) (ref <130)
Total CHOL/HDL Ratio: 2.5 (calc) (ref <5.0)
Triglycerides: 169 mg/dL — ABNORMAL HIGH (ref <150)

## 2020-05-20 DIAGNOSIS — Z03818 Encounter for observation for suspected exposure to other biological agents ruled out: Secondary | ICD-10-CM | POA: Diagnosis not present

## 2020-05-20 DIAGNOSIS — Z1152 Encounter for screening for COVID-19: Secondary | ICD-10-CM | POA: Diagnosis not present

## 2020-06-01 DIAGNOSIS — Z03818 Encounter for observation for suspected exposure to other biological agents ruled out: Secondary | ICD-10-CM | POA: Diagnosis not present

## 2020-06-01 DIAGNOSIS — Z1152 Encounter for screening for COVID-19: Secondary | ICD-10-CM | POA: Diagnosis not present

## 2020-06-13 ENCOUNTER — Ambulatory Visit: Payer: BC Managed Care – PPO | Admitting: Dermatology

## 2020-08-17 ENCOUNTER — Telehealth: Payer: Self-pay | Admitting: *Deleted

## 2020-08-17 NOTE — Telephone Encounter (Signed)
Attempted to contact and schedule lung screening scan. Message left for patient to call back to schedule. 

## 2020-08-24 ENCOUNTER — Telehealth: Payer: Self-pay | Admitting: *Deleted

## 2020-08-24 NOTE — Telephone Encounter (Signed)
Called patient and got his voicemail and left him a message that if he still interested in his annual lung screening CT scan if he could just give [place] a call [phone].  There are a few questions that we just need to go over to make sure he still qualifies and would be happy to go through that and set him up for his lung screening scan.

## 2020-08-25 ENCOUNTER — Telehealth: Payer: Self-pay | Admitting: *Deleted

## 2020-08-25 NOTE — Telephone Encounter (Signed)
Patient was attempted to be reached by Cordelia Pen, RN yesterday to set up lung ct scan apts.  mychart message sent to patient.

## 2020-08-26 ENCOUNTER — Telehealth: Payer: Self-pay

## 2020-08-26 NOTE — Telephone Encounter (Signed)
08/26/20 Spoke with patient he states he feels great and declines screening at this time, he prefers to do the lung screenings every other year. [facility]

## 2020-10-17 ENCOUNTER — Other Ambulatory Visit: Payer: Self-pay

## 2020-10-17 ENCOUNTER — Encounter: Payer: Self-pay | Admitting: Family Medicine

## 2020-10-17 DIAGNOSIS — Z79899 Other long term (current) drug therapy: Secondary | ICD-10-CM

## 2020-10-17 DIAGNOSIS — E7800 Pure hypercholesterolemia, unspecified: Secondary | ICD-10-CM

## 2020-10-17 MED ORDER — ROSUVASTATIN CALCIUM 10 MG PO TABS: 10.0000 mg | ORAL_TABLET | Freq: Every day | ORAL | 1 refills | Status: DC

## 2020-11-29 ENCOUNTER — Other Ambulatory Visit: Payer: Self-pay | Admitting: Family Medicine

## 2020-11-29 DIAGNOSIS — I10 Essential (primary) hypertension: Secondary | ICD-10-CM

## 2020-11-29 NOTE — Telephone Encounter (Signed)
  Notes to clinic: Patient is not scheduled for appt until 05/12/2021 Review for refill until this time   Requested Prescriptions  Pending Prescriptions Disp Refills   valsartan (DIOVAN) 160 MG tablet [Pharmacy Med Name: VALSARTAN 160 MG TABLET] 90 tablet 1    Sig: TAKE 1 TABLET BY MOUTH EVERY DAY      Cardiovascular:  Angiotensin Receptor Blockers Failed - 11/29/2020  6:53 AM      Failed - Cr in normal range and within 180 days    Creat  Date Value Ref Range Status  05/10/2020 1.05 0.70 - 1.25 mg/dL Final    Comment:    For patients >90 years of age, the reference limit for Creatinine is approximately 13% higher for people identified as African-American. .           Failed - K in normal range and within 180 days    Potassium  Date Value Ref Range Status  05/10/2020 4.9 3.5 - 5.3 mmol/L Final          Failed - Valid encounter within last 6 months    Recent Outpatient Visits           6 months ago Senile purpura [facility])   [facility] Alba Cory, MD   1 year ago Essential hypertension   [facility] Alba Cory, MD   1 year ago Panlobular emphysema [facility])   [facility] Alba Cory, MD   1 year ago Pure hypercholesterolemia   [facility] Alba Cory, MD   2 years ago Panlobular emphysema [facility])   [facility] Alba Cory, MD       Future Appointments             In 5 months Alba Cory, MD [facility]             Passed - Patient is not pregnant      Passed - Last BP in normal range    BP Readings from Last 1 Encounters:  05/10/20 136/82

## 2020-11-29 NOTE — Telephone Encounter (Signed)
Pt has an appt on 05/12/21

## 2020-12-22 DIAGNOSIS — Z20822 Contact with and (suspected) exposure to covid-19: Secondary | ICD-10-CM | POA: Diagnosis not present

## 2020-12-22 DIAGNOSIS — Z03818 Encounter for observation for suspected exposure to other biological agents ruled out: Secondary | ICD-10-CM | POA: Diagnosis not present

## 2021-01-10 ENCOUNTER — Emergency Department
Admission: EM | Admit: 2021-01-10 | Discharge: 2021-01-10 | Disposition: A | Discharge status: Home / Self Care | Payer: BC Managed Care – PPO | Attending: Emergency Medicine | Admitting: Emergency Medicine

## 2021-01-10 ENCOUNTER — Emergency Department: Payer: BC Managed Care – PPO

## 2021-01-10 DIAGNOSIS — F10129 Alcohol abuse with intoxication, unspecified: Secondary | ICD-10-CM | POA: Insufficient documentation

## 2021-01-10 DIAGNOSIS — S81012A Laceration without foreign body, left knee, initial encounter: Secondary | ICD-10-CM | POA: Diagnosis not present

## 2021-01-10 DIAGNOSIS — Z87891 Personal history of nicotine dependence: Secondary | ICD-10-CM | POA: Diagnosis not present

## 2021-01-10 DIAGNOSIS — S01511A Laceration without foreign body of lip, initial encounter: Secondary | ICD-10-CM | POA: Diagnosis not present

## 2021-01-10 DIAGNOSIS — X088XXA Exposure to other specified smoke, fire and flames, initial encounter: Secondary | ICD-10-CM | POA: Diagnosis not present

## 2021-01-10 DIAGNOSIS — I10 Essential (primary) hypertension: Secondary | ICD-10-CM | POA: Insufficient documentation

## 2021-01-10 DIAGNOSIS — Z7982 Long term (current) use of aspirin: Secondary | ICD-10-CM | POA: Insufficient documentation

## 2021-01-10 DIAGNOSIS — R197 Diarrhea, unspecified: Secondary | ICD-10-CM

## 2021-01-10 DIAGNOSIS — U071 COVID-19: Secondary | ICD-10-CM

## 2021-01-10 DIAGNOSIS — S8991XA Unspecified injury of right lower leg, initial encounter: Secondary | ICD-10-CM | POA: Diagnosis present

## 2021-01-10 DIAGNOSIS — R0602 Shortness of breath: Secondary | ICD-10-CM | POA: Diagnosis not present

## 2021-01-10 DIAGNOSIS — I639 Cerebral infarction, unspecified: Secondary | ICD-10-CM | POA: Insufficient documentation

## 2021-01-10 DIAGNOSIS — R413 Other amnesia: Secondary | ICD-10-CM | POA: Insufficient documentation

## 2021-01-10 DIAGNOSIS — Y92000 Kitchen of unspecified non-institutional (private) residence as  the place of occurrence of the external cause: Secondary | ICD-10-CM | POA: Insufficient documentation

## 2021-01-10 DIAGNOSIS — Y905 Blood alcohol level of 100-119 mg/100 ml: Secondary | ICD-10-CM | POA: Insufficient documentation

## 2021-01-10 DIAGNOSIS — J939 Pneumothorax, unspecified: Secondary | ICD-10-CM | POA: Insufficient documentation

## 2021-01-10 DIAGNOSIS — F10920 Alcohol use, unspecified with intoxication, uncomplicated: Secondary | ICD-10-CM

## 2021-01-10 DIAGNOSIS — T2102XA Burn of unspecified degree of abdominal wall, initial encounter: Secondary | ICD-10-CM | POA: Insufficient documentation

## 2021-01-10 DIAGNOSIS — Z85828 Personal history of other malignant neoplasm of skin: Secondary | ICD-10-CM | POA: Diagnosis not present

## 2021-01-10 DIAGNOSIS — J449 Chronic obstructive pulmonary disease, unspecified: Secondary | ICD-10-CM | POA: Diagnosis not present

## 2021-01-10 DIAGNOSIS — Z79899 Other long term (current) drug therapy: Secondary | ICD-10-CM | POA: Diagnosis not present

## 2021-01-10 DIAGNOSIS — S81011A Laceration without foreign body, right knee, initial encounter: Secondary | ICD-10-CM | POA: Insufficient documentation

## 2021-01-10 DIAGNOSIS — S81819A Laceration without foreign body, unspecified lower leg, initial encounter: Secondary | ICD-10-CM

## 2021-01-10 DIAGNOSIS — T310 Burns involving less than 10% of body surface: Secondary | ICD-10-CM | POA: Insufficient documentation

## 2021-01-10 DIAGNOSIS — Z23 Encounter for immunization: Secondary | ICD-10-CM | POA: Diagnosis not present

## 2021-01-10 DIAGNOSIS — W19XXXA Unspecified fall, initial encounter: Secondary | ICD-10-CM | POA: Diagnosis not present

## 2021-01-10 DIAGNOSIS — S0993XA Unspecified injury of face, initial encounter: Secondary | ICD-10-CM | POA: Diagnosis not present

## 2021-01-10 DIAGNOSIS — R112 Nausea with vomiting, unspecified: Secondary | ICD-10-CM

## 2021-01-10 LAB — COMPREHENSIVE METABOLIC PANEL
ALT: 49 U/L — ABNORMAL HIGH (ref 0–44)
AST: 61 U/L — ABNORMAL HIGH (ref 15–41)
Albumin: 4.4 g/dL (ref 3.5–5.0)
Alkaline Phosphatase: 74 U/L (ref 38–126)
Anion gap: 14 (ref 5–15)
BUN: 15 mg/dL (ref 8–23)
CO2: 27 mmol/L (ref 22–32)
Calcium: 9.2 mg/dL (ref 8.9–10.3)
Chloride: 101 mmol/L (ref 98–111)
Creatinine, Ser: 1.45 mg/dL — ABNORMAL HIGH (ref 0.61–1.24)
GFR, Estimated: 54 mL/min — ABNORMAL LOW (ref >60)
Glucose, Bld: 131 mg/dL — ABNORMAL HIGH (ref 70–99)
Potassium: 4.4 mmol/L (ref 3.5–5.1)
Sodium: 142 mmol/L (ref 135–145)
Total Bilirubin: 1.1 mg/dL (ref 0.3–1.2)
Total Protein: 8.2 g/dL — ABNORMAL HIGH (ref 6.5–8.1)

## 2021-01-10 LAB — DIFFERENTIAL
Abs Immature Granulocytes: 0.12 10*3/uL — ABNORMAL HIGH (ref 0.00–0.07)
Basophils Absolute: 0.1 10*3/uL (ref 0.0–0.1)
Basophils Relative: 0 %
Eosinophils Absolute: 0.2 10*3/uL (ref 0.0–0.5)
Eosinophils Relative: 1 %
Immature Granulocytes: 1 %
Lymphocytes Relative: 18 %
Lymphs Abs: 3.1 10*3/uL (ref 0.7–4.0)
Monocytes Absolute: 0.9 10*3/uL (ref 0.1–1.0)
Monocytes Relative: 5 %
Neutro Abs: 12.4 10*3/uL — ABNORMAL HIGH (ref 1.7–7.7)
Neutrophils Relative %: 75 %

## 2021-01-10 LAB — CBC
HCT: 49.1 % (ref 39.0–52.0)
Hemoglobin: 16.3 g/dL (ref 13.0–17.0)
MCH: 31.7 pg (ref 26.0–34.0)
MCHC: 33.2 g/dL (ref 30.0–36.0)
MCV: 95.3 fL (ref 80.0–100.0)
Platelets: 257 10*3/uL (ref 150–400)
RBC: 5.15 MIL/uL (ref 4.22–5.81)
RDW: 13.3 % (ref 11.5–15.5)
WBC: 16.8 10*3/uL — ABNORMAL HIGH (ref 4.0–10.5)
nRBC: 0 % (ref 0.0–0.2)

## 2021-01-10 LAB — APTT: aPTT: 24 seconds (ref 24–36)

## 2021-01-10 LAB — TSH: TSH: 3.014 u[IU]/mL (ref 0.350–4.500)

## 2021-01-10 LAB — TROPONIN I (HIGH SENSITIVITY)
Troponin I (High Sensitivity): 5 ng/L (ref <18)
Troponin I (High Sensitivity): 5 ng/L (ref <18)

## 2021-01-10 LAB — T4, FREE: Free T4: 0.81 ng/dL (ref 0.61–1.12)

## 2021-01-10 LAB — PROTIME-INR
INR: 1 (ref 0.8–1.2)
Prothrombin Time: 12.7 seconds (ref 11.4–15.2)

## 2021-01-10 LAB — ETHANOL: Alcohol, Ethyl (B): 113 mg/dL — ABNORMAL HIGH (ref <10)

## 2021-01-10 LAB — CBG MONITORING, ED: Glucose-Capillary: 116 mg/dL — ABNORMAL HIGH (ref 70–99)

## 2021-01-10 MED ORDER — SODIUM CHLORIDE 0.9% FLUSH
3.0000 mL | Freq: Once | INTRAVENOUS | Status: AC
  Administered 2021-01-10: 3 mL via INTRAVENOUS

## 2021-01-10 MED ORDER — ONDANSETRON HCL 4 MG/2ML IJ SOLN
4.0000 mg | Freq: Once | INTRAMUSCULAR | Status: AC
  Administered 2021-01-10: 4 mg via INTRAVENOUS
  Filled 2021-01-10: qty 2

## 2021-01-10 MED ORDER — ONDANSETRON 4 MG PO TBDP: 4.0000 mg | ORAL_TABLET | Freq: Three times a day (TID) | ORAL | 0 refills | Status: DC | PRN

## 2021-01-10 MED ORDER — BACITRACIN ZINC 500 UNIT/GM EX OINT: TOPICAL_OINTMENT | CUTANEOUS | 0 refills | Status: DC

## 2021-01-10 MED ORDER — DOXYCYCLINE HYCLATE 50 MG PO CAPS: 100.0000 mg | ORAL_CAPSULE | Freq: Two times a day (BID) | ORAL | 0 refills | Status: AC

## 2021-01-10 MED ORDER — TETANUS-DIPHTH-ACELL PERTUSSIS 5-2.5-18.5 LF-MCG/0.5 IM SUSY
0.5000 mL | PREFILLED_SYRINGE | Freq: Once | INTRAMUSCULAR | Status: AC
  Administered 2021-01-10: 0.5 mL via INTRAMUSCULAR
  Filled 2021-01-10: qty 0.5

## 2021-01-10 MED ORDER — LIDOCAINE HCL (PF) 1 % IJ SOLN
10.0000 mL | Freq: Once | INTRAMUSCULAR | Status: AC
  Administered 2021-01-10: 10 mL via INTRADERMAL
  Filled 2021-01-10: qty 10

## 2021-01-10 MED ORDER — SODIUM CHLORIDE 0.9 % IV BOLUS
1000.0000 mL | Freq: Once | INTRAVENOUS | Status: AC
  Administered 2021-01-10: 1000 mL via INTRAVENOUS

## 2021-01-10 NOTE — Discharge Instructions (Signed)
Take Zofran to help with nausea.  Stay well-hydrated with Gatorade without sugar, Pedialyte.  Take the antibiotics to help prevent infection of your leg.  Put bacitracin over your burn.  You can follow-up with [facility] burn clinic if it is not healing .  cut down your alcohol use.   Located in: [facility] Address: [address], [place], [place] [address] Hours:  Open ? Closes 4:30PM Phone: [phone]

## 2021-01-10 NOTE — ED Provider Notes (Addendum)
LACERATION REPAIR Performed by: Faythe Ghee Authorized by: Faythe Ghee Consent: Verbal consent obtained. Risks and benefits: risks, benefits and alternatives were discussed Consent given by: patient Patient identity confirmed: provided demographic data Prepped and Draped in normal sterile fashion Wound explored  Laceration Location: left knee  Laceration Length: 6cm  No Foreign Bodies seen or palpated  Anesthesia: local infiltration  Local anesthetic: lidocaine 1% w/o epinephrine  Anesthetic total: 3 ml  Irrigation method: syringe Amount of cleaning: standard  Skin closure: nylon, simple sutures, well approximated  Number of sutures: 6  Technique: simple  Patient tolerance: Patient tolerated the procedure well with no immediate complications.    Faythe Ghee, PA-C 01/10/21 1411  LACERATION REPAIR Performed by: Faythe Ghee Authorized by: Faythe Ghee Consent: Verbal consent obtained. Risks and benefits: risks, benefits and alternatives were discussed Consent given by: patient Patient identity confirmed: provided demographic data Prepped and Draped in normal sterile fashion Wound explored  Laceration Location: right knee  Laceration Length: 6cm  No Foreign Bodies seen or palpated  Anesthesia: local infiltration  Local anesthetic: lidocaine 1% no epinephrine  Anesthetic total: 4 ml  Irrigation method: syringe Amount of cleaning: standard  Skin closure: sutures  Number of sutures: 6  Technique: simple suture  Patient tolerance: Patient tolerated the procedure well with no immediate complications.  No FB noted on either laceration, right knee laceration was performed by Eddie North, PA Student under my supervision r   Faythe Ghee, PA-C 01/10/21 1430    Concha Se, MD 01/12/21 [phone]

## 2021-01-10 NOTE — ED Notes (Signed)
Pt OOB to BR with steady gait. Pt reports loose stools, denies watery or blood in stool.
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
## 2021-01-10 NOTE — ED Notes (Signed)
Portable Xray at bedside.

## 2021-01-10 NOTE — ED Notes (Signed)
Dressings placed to bilat knees. Pt and spouse educated on wound care. Verbalized understanding. Deny any questions.

## 2021-01-10 NOTE — ED Notes (Signed)
Pt ambulated in room. SaO2 > 94% while ambulating. MD aware.

## 2021-01-10 NOTE — ED Notes (Signed)
Pt transported to CT via stretcher at this time.  
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
## 2021-01-30 ENCOUNTER — Emergency Department: Payer: BC Managed Care – PPO

## 2021-01-30 ENCOUNTER — Other Ambulatory Visit: Payer: Self-pay

## 2021-01-30 ENCOUNTER — Emergency Department
Admission: EM | Admit: 2021-01-30 | Discharge: 2021-01-30 | Disposition: A | Discharge status: Home / Self Care | Payer: BC Managed Care – PPO | Attending: Emergency Medicine | Admitting: Emergency Medicine

## 2021-01-30 DIAGNOSIS — J181 Lobar pneumonia, unspecified organism: Secondary | ICD-10-CM | POA: Insufficient documentation

## 2021-01-30 DIAGNOSIS — J439 Emphysema, unspecified: Secondary | ICD-10-CM

## 2021-01-30 DIAGNOSIS — U099 Post covid-19 condition, unspecified: Secondary | ICD-10-CM

## 2021-01-30 DIAGNOSIS — R0602 Shortness of breath: Secondary | ICD-10-CM | POA: Diagnosis not present

## 2021-01-30 DIAGNOSIS — Z7982 Long term (current) use of aspirin: Secondary | ICD-10-CM | POA: Diagnosis not present

## 2021-01-30 DIAGNOSIS — Z87891 Personal history of nicotine dependence: Secondary | ICD-10-CM | POA: Insufficient documentation

## 2021-01-30 DIAGNOSIS — J189 Pneumonia, unspecified organism: Secondary | ICD-10-CM

## 2021-01-30 DIAGNOSIS — Z79899 Other long term (current) drug therapy: Secondary | ICD-10-CM | POA: Insufficient documentation

## 2021-01-30 DIAGNOSIS — I10 Essential (primary) hypertension: Secondary | ICD-10-CM | POA: Diagnosis not present

## 2021-01-30 DIAGNOSIS — R079 Chest pain, unspecified: Secondary | ICD-10-CM | POA: Diagnosis not present

## 2021-01-30 LAB — CBC
HCT: 44.5 % (ref 39.0–52.0)
Hemoglobin: 15.3 g/dL (ref 13.0–17.0)
MCH: 31.7 pg (ref 26.0–34.0)
MCHC: 34.4 g/dL (ref 30.0–36.0)
MCV: 92.3 fL (ref 80.0–100.0)
Platelets: 256 10*3/uL (ref 150–400)
RBC: 4.82 MIL/uL (ref 4.22–5.81)
RDW: 12.8 % (ref 11.5–15.5)
WBC: 10.9 10*3/uL — ABNORMAL HIGH (ref 4.0–10.5)
nRBC: 0 % (ref 0.0–0.2)

## 2021-01-30 LAB — HEPATIC FUNCTION PANEL
ALT: 21 U/L (ref 0–44)
AST: 24 U/L (ref 15–41)
Albumin: 3.5 g/dL (ref 3.5–5.0)
Alkaline Phosphatase: 69 U/L (ref 38–126)
Bilirubin, Direct: 0.2 mg/dL (ref 0.0–0.2)
Indirect Bilirubin: 0.8 mg/dL (ref 0.3–0.9)
Total Bilirubin: 1 mg/dL (ref 0.3–1.2)
Total Protein: 7.8 g/dL (ref 6.5–8.1)

## 2021-01-30 LAB — BASIC METABOLIC PANEL
Anion gap: 13 (ref 5–15)
BUN: 15 mg/dL (ref 8–23)
CO2: 22 mmol/L (ref 22–32)
Calcium: 8.9 mg/dL (ref 8.9–10.3)
Chloride: 101 mmol/L (ref 98–111)
Creatinine, Ser: 1.18 mg/dL (ref 0.61–1.24)
GFR, Estimated: >60 mL/min (ref >60)
Glucose, Bld: 126 mg/dL — ABNORMAL HIGH (ref 70–99)
Potassium: 3.6 mmol/L (ref 3.5–5.1)
Sodium: 136 mmol/L (ref 135–145)

## 2021-01-30 LAB — TROPONIN I (HIGH SENSITIVITY): Troponin I (High Sensitivity): 9 ng/L (ref <18)

## 2021-01-30 MED ORDER — AMOXICILLIN-POT CLAVULANATE 875-125 MG PO TABS: 1.0000 | ORAL_TABLET | Freq: Two times a day (BID) | ORAL | 0 refills | Status: DC

## 2021-01-30 MED ORDER — PREDNISONE 20 MG PO TABS: 40.0000 mg | ORAL_TABLET | Freq: Every day | ORAL | 0 refills | Status: AC

## 2021-01-30 MED ORDER — GUAIFENESIN ER 600 MG PO TB12: 600.0000 mg | ORAL_TABLET | Freq: Two times a day (BID) | ORAL | 0 refills | Status: AC

## 2021-01-30 NOTE — ED Triage Notes (Signed)
Pt arrives via POV with CC of fatigue/CP/shortness of breath that has persisted since positive COVID test on 01/06/2021. Pt appears to be in NAD at this time.

## 2021-01-30 NOTE — ED Provider Notes (Signed)
[facility] Emergency Department Provider Note  ____________________________________________  Time seen: Approximately 10:32 AM  I have reviewed the triage vital signs and the nursing notes.   HISTORY  Chief Complaint Fatigue    HPI Aaron Ross is a 65 y.o. male with a past history of COPD GERD hypertension who comes ED complaining of worsening shortness of breath and cough for the past 3 days.  He has a productive cough as well as fever and chills at home.  He has left chest pain with coughing which is nonradiating.  Symptoms are constant, no alleviating factors.  He has had persistent fatigue and chest discomfort for the past month since having COVID.  He has known emphysema, he used to be a 3 pack-a-day smoker but quit 3 years ago.  He has previously seen pulmonology but not recently, not on any COPD medication.    Past Medical History:  Diagnosis Date   COPD (chronic obstructive pulmonary disease) (HCC)    GERD (gastroesophageal reflux disease)    Hx of nonmelanoma skin cancer    Treated in [place] several >25 years ago   Hypertension    Sleep apnea      Patient Active Problem List   Diagnosis Date Noted   Senile purpura (HCC) 11/03/2019   Lung nodules 11/03/2019   Atherosclerosis of aorta (HCC) 10/23/2017   History of pneumothorax 10/11/2016   Hyperlipidemia 11/29/2015   GERD (gastroesophageal reflux disease) 11/29/2015   HTN (hypertension) 01/11/2015   COPD (chronic obstructive pulmonary disease) (HCC) 01/11/2015   Erectile dysfunction 01/11/2015   Hyperglycemia 01/11/2015     Past Surgical History:  Procedure Laterality Date   COSMETIC SURGERY     EYE SURGERY     HERNIA REPAIR       Prior to Admission medications   Medication Sig Start Date End Date Taking? Authorizing Provider  amoxicillin-clavulanate (AUGMENTIN) 875-125 MG tablet Take 1 tablet by mouth 2 (two) times daily. 01/30/21  Yes Sharman Cheek, MD  guaiFENesin  (MUCINEX) 600 MG 12 hr tablet Take 1 tablet (600 mg total) by mouth 2 (two) times daily for 15 days. 01/30/21 02/14/21 Yes Sharman Cheek, MD  predniSONE (DELTASONE) 20 MG tablet Take 2 tablets (40 mg total) by mouth daily with breakfast for 4 days. 01/30/21 02/03/21 Yes Sharman Cheek, MD  aspirin 81 MG EC tablet TAKE 1 TABLET BY MOUTH EVERY DAY 05/26/18   Alba Cory, MD  bacitracin ointment Apply to affected area daily 01/10/21 01/10/22  Concha Se, MD  Cholecalciferol (VITAMIN D3) 50 MCG (2000 UT) CAPS Take 1 capsule (2,000 Units total) by mouth daily. 05/10/20   Alba Cory, MD  ondansetron (ZOFRAN ODT) 4 MG disintegrating tablet Take 1 tablet (4 mg total) by mouth every 8 (eight) hours as needed for nausea or vomiting. 01/10/21   Concha Se, MD  pantoprazole (PROTONIX) 40 MG tablet Take 1 tablet (40 mg total) by mouth daily. 05/10/20   Alba Cory, MD  rosuvastatin (CRESTOR) 10 MG tablet Take 1 tablet (10 mg total) by mouth daily. 10/17/20   Alba Cory, MD  sildenafil (VIAGRA) 100 MG tablet Take 1 tablet (100 mg total) by mouth daily as needed for erectile dysfunction. 10/28/18   Alba Cory, MD  valsartan (DIOVAN) 160 MG tablet TAKE 1 TABLET BY MOUTH EVERY DAY 11/29/20   Alba Cory, MD  losartan (COZAAR) 100 MG tablet Take 1 tablet (100 mg total) by mouth daily. 10/28/18 01/26/19  Alba Cory, MD  Allergies Patient has no known allergies.   Family History  Problem Relation Age of Onset   Hypertension Father    Prostate cancer Neg Hx    Bladder Cancer Neg Hx    Kidney cancer Neg Hx     Social History Social History   Tobacco Use   Smoking status: Former    Packs/day: 2.00    Years: 43.00    Pack years: 86.00    Types: Cigarettes    Start date: 06/25/1972    Quit date: 07/15/2017    Years since quitting: 3.5   Smokeless tobacco: Never  Vaping Use   Vaping Use: Never used  Substance Use Topics   Alcohol use: Yes    Alcohol/week: 8.0 standard  drinks    Types: 8 Standard drinks or equivalent per week   Drug use: No    Review of Systems  Constitutional:   No fever or chills.  ENT:   No sore throat. No rhinorrhea. Cardiovascular:   Positive chest pain without syncope. Respiratory:   Positive shortness of breath and productive cough. Gastrointestinal:   Negative for abdominal pain, vomiting and diarrhea.  Musculoskeletal:   Negative for focal pain or swelling All other systems reviewed and are negative except as documented above in ROS and HPI.  ____________________________________________   PHYSICAL EXAM:  VITAL SIGNS: ED Triage Vitals  Enc Vitals Group     BP 01/30/21 0804 103/78     Pulse Rate 01/30/21 0804 84     Resp 01/30/21 0804 18     Temp 01/30/21 0804 98.2 F (36.8 C)     Temp Source 01/30/21 0804 Oral     SpO2 01/30/21 0804 96 %     Weight 01/30/21 0809 164 lb (74.4 kg)     Height 01/30/21 0809 6\' 1"  (1.854 m)     Head Circumference --      Peak Flow --      Pain Score 01/30/21 0808 5     Pain Loc --      Pain Edu? --      Excl. in GC? --     Vital signs reviewed, nursing assessments reviewed.   Constitutional:   Alert and oriented. Non-toxic appearance. Eyes:   Conjunctivae are normal. EOMI. PERRL. ENT      Head:   Normocephalic and atraumatic.      Nose:   Wearing a mask.      Mouth/Throat:   Wearing a mask.      Neck:   No meningismus. Full ROM. Hematological/Lymphatic/Immunilogical:   No cervical lymphadenopathy. Cardiovascular:   RRR. Symmetric bilateral radial and DP pulses.  No murmurs. Cap refill less than 2 seconds. Respiratory:   Normal respiratory effort without tachypnea/retractions. Breath sounds are clear and equal bilaterally. No wheezes/rales/rhonchi. Musculoskeletal:   Normal range of motion in all extremities.   No lower extremity tenderness.  No edema. Neurologic:   Normal speech and language.  Motor grossly intact. No acute focal neurologic deficits are appreciated.     ____________________________________________    LABS (pertinent positives/negatives) (all labs ordered are listed, but only abnormal results are displayed) Labs Reviewed  BASIC METABOLIC PANEL - Abnormal; Notable for the following components:      Result Value   Glucose, Bld 126 (*)    All other components within normal limits  CBC - Abnormal; Notable for the following components:   WBC 10.9 (*)    All other components within normal limits  HEPATIC FUNCTION PANEL  TROPONIN I (HIGH SENSITIVITY)  TROPONIN I (HIGH SENSITIVITY)   ____________________________________________   EKG  Interpreted by me Normal sinus rhythm rate of 80.  Normal axis intervals QRS ST segments and T waves  ____________________________________________    RADIOLOGY  DG Chest 2 View  Result Date: 01/30/2021 CLINICAL DATA:  Shortness of breath, chest pain. EXAM: CHEST - 2 VIEW COMPARISON:  01/10/2021 FINDINGS: Severe emphysematous changes throughout both lungs. New opacity at the medial left lung apex with increased interstitial densities in this area. This is new since 01/10/2021 and most compatible with an infectious or inflammatory process. Slightly increased densities at the left lung base as well. Heart size is stable. No large pleural effusions. No acute bone abnormality. Negative for pneumothorax. IMPRESSION: 1. New densities in left lung, particularly at the medial left lung apex. Findings are suggestive for pneumonia. Recommend follow-up to ensure resolution. 2. Severe emphysematous disease. Electronically Signed   By: Richarda Overlie M.D.   On: 01/30/2021 08:33    ____________________________________________   PROCEDURES Procedures  ____________________________________________    CLINICAL IMPRESSION / ASSESSMENT AND PLAN / ED COURSE  Medications ordered in the ED: Medications - No data to display  Pertinent labs & imaging results that were available during my care of the patient were  reviewed by me and considered in my medical decision making (see chart for details).  Swanson Farnell was evaluated in Emergency Department on 01/30/2021 for the symptoms described in the history of present illness. He was evaluated in the context of the global COVID-19 pandemic, which necessitated consideration that the patient might be at risk for infection with the SARS-CoV-2 virus that causes COVID-19. Institutional protocols and algorithms that pertain to the evaluation of patients at risk for COVID-19 are in a state of rapid change based on information released by regulatory bodies including the CDC and federal and state organizations. These policies and algorithms were followed during the patient's care in the ED.   Patient presents with long COVID symptoms and superimposed commune acquired pneumonia seen on chest x-ray.  He is not septic, vital signs are normal here.  Resting oxygen saturation is about 95%, increases to 97% with ambulation in the room.  I doubt ACS PE dissection or carditis.  High-sensitivity troponin is normal.  We will start Augmentin, guaifenesin, prednisone burst.  He will call his PCP for continued follow-up and referral back to pulmonology.      ____________________________________________   FINAL CLINICAL IMPRESSION(S) / ED DIAGNOSES    Final diagnoses:  Community acquired pneumonia of left lower lobe of lung  Pulmonary emphysema, unspecified emphysema type (HCC)  Post-COVID syndrome     ED Discharge Orders          Ordered    amoxicillin-clavulanate (AUGMENTIN) 875-125 MG tablet  2 times daily        01/30/21 1031    guaiFENesin (MUCINEX) 600 MG 12 hr tablet  2 times daily        01/30/21 1031    predniSONE (DELTASONE) 20 MG tablet  Daily with breakfast        01/30/21 1031            Portions of this note were generated with dragon dictation software. Dictation errors may occur despite best attempts at proofreading.    Sharman Cheek,  MD 01/30/21 1036

## 2021-02-09 ENCOUNTER — Other Ambulatory Visit: Payer: Self-pay | Admitting: *Deleted

## 2021-02-09 DIAGNOSIS — Z87891 Personal history of nicotine dependence: Secondary | ICD-10-CM
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
## 2021-02-15 ENCOUNTER — Other Ambulatory Visit: Payer: Self-pay

## 2021-02-15 ENCOUNTER — Encounter: Payer: Self-pay | Admitting: Family Medicine

## 2021-02-15 ENCOUNTER — Ambulatory Visit: Payer: BC Managed Care – PPO | Admitting: Family Medicine

## 2021-02-15 VITALS — BP 110/64 | HR 79 | Temp 98.1°F | Resp 16 | Ht 73.0 in | Wt 163.0 lb

## 2021-02-15 DIAGNOSIS — Z8616 Personal history of COVID-19: Secondary | ICD-10-CM

## 2021-02-15 DIAGNOSIS — J431 Panlobular emphysema: Secondary | ICD-10-CM | POA: Diagnosis not present

## 2021-02-15 DIAGNOSIS — Z8701 Personal history of pneumonia (recurrent): Secondary | ICD-10-CM

## 2021-02-15 NOTE — Patient Instructions (Signed)
[facility] [phone]

## 2021-02-20 ENCOUNTER — Other Ambulatory Visit: Payer: Self-pay

## 2021-02-20 ENCOUNTER — Ambulatory Visit
Admission: RE | Admit: 2021-02-20 | Discharge: 2021-02-20 | Disposition: A | Discharge status: Home / Self Care | Payer: BC Managed Care – PPO | Source: Ambulatory Visit | Attending: Acute Care | Admitting: Acute Care

## 2021-02-20 DIAGNOSIS — Z87891 Personal history of nicotine dependence: Secondary | ICD-10-CM | POA: Diagnosis not present

## 2021-02-22 ENCOUNTER — Telehealth: Payer: Self-pay | Admitting: Acute Care

## 2021-02-22 DIAGNOSIS — Z87891 Personal history of nicotine dependence: Secondary | ICD-10-CM

## 2021-02-22 NOTE — Telephone Encounter (Signed)
Received a call report from [facility] in regards to the lung cancer screen CT the patient had on 02/19/21. Below is a copy of the impression:   "IMPRESSION: Lung-RADS 4B, suspicious. Additional imaging evaluation or consultation with Pulmonology or Thoracic Surgery recommended.   Bandlike opacity/scarring in the medial left lung apex, mean diameter 20.5 mm. This appearance favors post infectious/inflammatory scarring but is new from the prior. Consider 3 month follow-up low-dose lung cancer screening CT chest or PET-CT.   These results will be called to the ordering clinician or representative by the Radiologist Assistant, and communication documented in the PACS or [employer].   Aortic Atherosclerosis (ICD10-I70.0) and Emphysema (ICD10-J43.9)."  SG, can you please advise? Thanks!

## 2021-02-23 NOTE — Telephone Encounter (Signed)
I have called the patient with the results of his low-dose CT.  I had Dr. Gonzalez reviewed the scan with me, and based on previous PET scan and low-dose CT she feels a 3-month follow-up CT is best option.  I explained this to the patient, who is in agreement with this plan. Denise please fax results to PCP and schedule 3 month follow-up low-dose CT. Thanks so much

## 2021-02-24 NOTE — Telephone Encounter (Signed)
CT results faxed to PCP with plan to repeat CT in 3 months. Order placed for 3 mth nodule f/u Ct.

## 2021-02-27 ENCOUNTER — Other Ambulatory Visit: Payer: Self-pay | Admitting: Family Medicine

## 2021-02-27 DIAGNOSIS — K219 Gastro-esophageal reflux disease without esophagitis: Secondary | ICD-10-CM

## 2021-03-01 NOTE — Progress Notes (Signed)
I have called the patient with the results of his low-dose CT.  I had Dr. Jayme Cloud reviewed the scan with me, and based on previous PET scan and low-dose CT she feels a 93-month follow-up CT is best option.  I explained this to the patient, who is in agreement with this plan. Denise please fax results to PCP and schedule 3 month follow-up low-dose CT. Thanks so much

## 2021-05-11 NOTE — Progress Notes (Deleted)
Name: Aaron Ross   MRN: [id number]    DOB: 01/04/1956   Date:05/11/2021       Progress Note  Subjective  Chief Complaint  Annual Exam  HPI  Patient presents for annual CPE ***.  IPSS Questionnaire (AUA-7): Over the past month.   1)  How often have you had a sensation of not emptying your bladder completely after you finish urinating?  {Rating:19227}  2)  How often have you had to urinate again less than two hours after you finished urinating? {Rating:19227}  3)  How often have you found you stopped and started again several times when you urinated?  {Rating:19227}  4) How difficult have you found it to postpone urination?  {Rating:19227}  5) How often have you had a weak urinary stream?  {Rating:19227}  6) How often have you had to push or strain to begin urination?  {Rating:19227}  7) How many times did you most typically get up to urinate from the time you went to bed until the time you got up in the morning?  {Rating:19228}  Total score:  0-7 mildly symptomatic   8-19 moderately symptomatic   20-35 severely symptomatic     Diet: *** Exercise: ***  Depression: phq 9 is {gen pos ION:629528} Depression screen [facility] 2/9 02/15/2021 05/10/2020 05/10/2020 11/03/2019 08/11/2019  Decreased Interest 0 0 0 0 0  Down, Depressed, Hopeless 0 0 0 0 0  PHQ - 2 Score 0 0 0 0 0  Altered sleeping - 1 - 0 0  Tired, decreased energy - 0 - 0 0  Change in appetite - 1 - 0 0  Feeling bad or failure about yourself  - 0 - 0 0  Trouble concentrating - 0 - 0 0  Moving slowly or fidgety/restless - 0 - 0 0  Suicidal thoughts - 0 - 0 0  PHQ-9 Score - 2 - 0 0  Difficult doing work/chores - Not difficult at all - Not difficult at all Not difficult at all  Some recent data might be hidden    Hypertension:  BP Readings from Last 3 Encounters:  02/15/21 110/64  01/30/21 110/68  01/10/21 (!) 133/93    Obesity: Wt Readings from Last 3 Encounters:  02/20/21 165 lb (74.8 kg)  02/15/21 163 lb (73.9 kg)   01/30/21 159 lb 6.3 oz (72.3 kg)   BMI Readings from Last 3 Encounters:  02/20/21 21.77 kg/m  02/15/21 21.51 kg/m  01/30/21 21.03 kg/m     Lipids:  Lab Results  Component Value Date   CHOL 191 05/10/2020   CHOL 173 11/03/2019   CHOL 165 10/28/2018   Lab Results  Component Value Date   HDL 76 05/10/2020   HDL 81 11/03/2019   HDL 63 10/28/2018   Lab Results  Component Value Date   LDLCALC 88 05/10/2020   LDLCALC 79 11/03/2019   LDLCALC 77 10/28/2018   Lab Results  Component Value Date   TRIG 169 (H) 05/10/2020   TRIG 52 11/03/2019   TRIG 149 10/28/2018   Lab Results  Component Value Date   CHOLHDL 2.5 05/10/2020   CHOLHDL 2.1 11/03/2019   CHOLHDL 2.6 10/28/2018   No results found for: LDLDIRECT Glucose:  Glucose, Bld  Date Value Ref Range Status  01/30/2021 126 (H) 70 - 99 mg/dL Final    Comment:    Glucose reference range applies only to samples taken after fasting for at least 8 hours.  01/10/2021 131 (H) 70 - 99 mg/dL Final  Comment:    Glucose reference range applies only to samples taken after fasting for at least 8 hours.  05/10/2020 91 65 - 99 mg/dL Final    Comment:    .            Fasting reference interval .    Glucose-Capillary  Date Value Ref Range Status  01/10/2021 116 (H) 70 - 99 mg/dL Final    Comment:    Glucose reference range applies only to samples taken after fasting for at least 8 hours.  06/04/2019 78 70 - 99 mg/dL Final    Flowsheet Row Office Visit from 05/10/2020 in [facility]  AUDIT-C Score 6       Married STD testing and prevention (HIV/chl/gon/syphilis): 10/23/17 Hep C: 10/23/17  Skin cancer: Discussed monitoring for atypical lesions Colorectal cancer: 10/31/17 Prostate cancer:  No results found for: PSA   Lung cancer: Low Dose CT Chest recommended if Age 82-80 years, 30 pack-year currently smoking OR have quit w/in 15years. Patient does not qualify.   AAA: The USPSTF recommends one-time  screening with ultrasonography in men ages 42 to 26 years who have ever smoked ECG:  01/31/21  Vaccines:  HPV: up to at age 42 , ask insurance if age between 94-45  Shingrix: 70-64 yo and ask insurance if covered when patient above 54 yo Pneumonia: educated and discussed with patient. Flu: educated and discussed with patient.  Advanced Care Planning: A voluntary discussion about advance care planning including the explanation and discussion of advance directives.  Discussed health care proxy and Living will, and the patient was able to identify a health care proxy as wife.  Patient does not have a living will at present time. If patient does have living will, I have requested they bring this to the clinic to be scanned in to their chart.  Patient Active Problem List   Diagnosis Date Noted   Senile purpura (HCC) 11/03/2019   Lung nodules 11/03/2019   Atherosclerosis of aorta (HCC) 10/23/2017   History of pneumothorax 10/11/2016   Hyperlipidemia 11/29/2015   GERD (gastroesophageal reflux disease) 11/29/2015   HTN (hypertension) 01/11/2015   COPD (chronic obstructive pulmonary disease) (HCC) 01/11/2015   Erectile dysfunction 01/11/2015   Hyperglycemia 01/11/2015    Past Surgical History:  Procedure Laterality Date   COSMETIC SURGERY     EYE SURGERY     HERNIA REPAIR      Family History  Problem Relation Age of Onset   Hypertension Father    Prostate cancer Neg Hx    Bladder Cancer Neg Hx    Kidney cancer Neg Hx     Social History   Socioeconomic History   Marital status: Married    Spouse name: Not on file   Number of children: 0   Years of education: Not on file   Highest education level: 9th grade  Occupational History   Occupation: Customer service manager   Tobacco Use   Smoking status: Former    Packs/day: 2.00    Years: 43.00    Pack years: 86.00    Types: Cigarettes    Start date: 06/25/1972    Quit date: 07/15/2017    Years since quitting: 3.8   Smokeless tobacco:  Never  Vaping Use   Vaping Use: Never used  Substance and Sexual Activity   Alcohol use: Yes    Alcohol/week: 8.0 standard drinks    Types: 8 Standard drinks or equivalent per week   Drug use: No  Sexual activity: Yes    Partners: Female    Birth control/protection: Surgical  Other Topics Concern   Not on file  Social History Narrative   Divorced, never had children, remarried January 2020, she is from [place]    He works full time in [place], Press photographer   He is originally [place] Moved here with parents and siblings when he was 16.    Siblings still in [place]   Social Determinants of Health   Financial Resource Strain: Low Risk    Difficulty of Paying Living Expenses: Not hard at all  Food Insecurity: No Food Insecurity   Worried About Programme researcher, broadcasting/film/video in the Last Year: Never true   Barista in the Last Year: Never true  Transportation Needs: No Transportation Needs   Lack of Transportation (Medical): No   Lack of Transportation (Non-Medical): No  Physical Activity: Insufficiently Active   Days of Exercise per Week: 5 days   Minutes of Exercise per Session: 20 min  Stress: Stress Concern Present   Feeling of Stress : To some extent  Social Connections: Moderately Isolated   Frequency of Communication with Friends and Family: Twice a week   Frequency of Social Gatherings with Friends and Family: Once a week   Attends Religious Services: Never   Database administrator or Organizations: No   Attends Engineer, structural: Never   Marital Status: Married  Catering manager Violence: Not At Risk   Fear of Current or Ex-Partner: No   Emotionally Abused: No   Physically Abused: No   Sexually Abused: No     Current Outpatient Medications:    amoxicillin-clavulanate (AUGMENTIN) 875-125 MG tablet, Take 1 tablet by mouth 2 (two) times daily., Disp: 20 tablet, Rfl: 0   aspirin 81 MG EC tablet, TAKE 1 TABLET BY MOUTH EVERY DAY, Disp: 30 tablet, Rfl: 0    bacitracin ointment, Apply to affected area daily, Disp: 30 g, Rfl: 0   Cholecalciferol (VITAMIN D3) 50 MCG (2000 UT) CAPS, Take 1 capsule (2,000 Units total) by mouth daily., Disp: 100 capsule, Rfl: 0   ondansetron (ZOFRAN ODT) 4 MG disintegrating tablet, Take 1 tablet (4 mg total) by mouth every 8 (eight) hours as needed for nausea or vomiting., Disp: 20 tablet, Rfl: 0   pantoprazole (PROTONIX) 40 MG tablet, TAKE 1 TABLET BY MOUTH EVERY DAY, Disp: 90 tablet, Rfl: 0   rosuvastatin (CRESTOR) 10 MG tablet, Take 1 tablet (10 mg total) by mouth daily., Disp: 90 tablet, Rfl: 1   sildenafil (VIAGRA) 100 MG tablet, Take 1 tablet (100 mg total) by mouth daily as needed for erectile dysfunction., Disp: 30 tablet, Rfl: 0   valsartan (DIOVAN) 160 MG tablet, TAKE 1 TABLET BY MOUTH EVERY DAY, Disp: 90 tablet, Rfl: 1  No Known Allergies   ROS  ***   Objective  There were no vitals filed for this visit.  There is no height or weight on file to calculate BMI.  Physical Exam ***  No results found for this or any previous visit (from the past 2160 hour(s)).   Fall Risk: Fall Risk  02/15/2021 05/10/2020 11/03/2019 08/11/2019 05/06/2019  Falls in the past year? 1 0 0 0 0  Number falls in past yr: 0 0 0 0 0  Injury with Fall? 1 0 0 0 0  Risk for fall due to : No Fall Risks - - - -  Follow up Falls prevention discussed - Falls evaluation completed Falls  evaluation completed -      Functional Status Survey:      Assessment & Plan  1. Well adult exam ***    -Prostate cancer screening and PSA options (with potential risks and benefits of testing vs not testing) were discussed along with recent recs/guidelines. -USPSTF grade A and B recommendations reviewed with patient; age-appropriate recommendations, preventive care, screening tests, etc discussed and encouraged; healthy living encouraged; see AVS for patient education given to patient -Discussed importance of 150 minutes of physical  activity weekly, eat two servings of fish weekly, eat one serving of tree nuts ( cashews, pistachios, pecans, almonds.Marland Kitchen) every other day, eat 6 servings of fruit/vegetables daily and drink plenty of water and avoid sweet beverages.  -Reviewed Health Maintenance: ***

## 2021-05-12 ENCOUNTER — Encounter: Payer: BC Managed Care – PPO | Admitting: Family Medicine

## 2021-05-16 ENCOUNTER — Encounter: Payer: Self-pay | Admitting: Family Medicine

## 2021-05-16 ENCOUNTER — Other Ambulatory Visit: Payer: Self-pay | Admitting: Family Medicine

## 2021-05-16 ENCOUNTER — Other Ambulatory Visit: Payer: Self-pay

## 2021-05-16 ENCOUNTER — Ambulatory Visit (INDEPENDENT_AMBULATORY_CARE_PROVIDER_SITE_OTHER): Payer: BC Managed Care – PPO | Admitting: Family Medicine

## 2021-05-16 VITALS — BP 126/82 | HR 71 | Temp 98.0°F | Resp 16 | Ht 73.0 in | Wt 168.8 lb

## 2021-05-16 DIAGNOSIS — E7800 Pure hypercholesterolemia, unspecified: Secondary | ICD-10-CM | POA: Diagnosis not present

## 2021-05-16 DIAGNOSIS — R739 Hyperglycemia, unspecified: Secondary | ICD-10-CM | POA: Diagnosis not present

## 2021-05-16 DIAGNOSIS — Z0000 Encounter for general adult medical examination without abnormal findings: Secondary | ICD-10-CM

## 2021-05-16 DIAGNOSIS — I10 Essential (primary) hypertension: Secondary | ICD-10-CM

## 2021-05-16 DIAGNOSIS — Z125 Encounter for screening for malignant neoplasm of prostate: Secondary | ICD-10-CM

## 2021-05-16 DIAGNOSIS — Z1211 Encounter for screening for malignant neoplasm of colon: Secondary | ICD-10-CM | POA: Diagnosis not present

## 2021-05-16 DIAGNOSIS — J439 Emphysema, unspecified: Secondary | ICD-10-CM

## 2021-05-16 DIAGNOSIS — K219 Gastro-esophageal reflux disease without esophagitis: Secondary | ICD-10-CM

## 2021-05-16 DIAGNOSIS — E785 Hyperlipidemia, unspecified: Secondary | ICD-10-CM

## 2021-05-16 DIAGNOSIS — R918 Other nonspecific abnormal finding of lung field: Secondary | ICD-10-CM

## 2021-05-16 MED ORDER — PROAIR RESPICLICK 108 (90 BASE) MCG/ACT IN AEPB: 2.0000 | INHALATION_SPRAY | RESPIRATORY_TRACT | 3 refills | Status: DC | PRN

## 2021-05-16 MED ORDER — ROSUVASTATIN CALCIUM 10 MG PO TABS: 10.0000 mg | ORAL_TABLET | Freq: Every day | ORAL | 3 refills | Status: DC

## 2021-05-16 NOTE — Telephone Encounter (Signed)
Requested Prescriptions  Refused Prescriptions Disp Refills  . PROAIR RESPICLICK 108 (90 Base) MCG/ACT AEPB [Pharmacy Med Name: PROAIR RESPICLICK 90 MCG INHLR] 1 each 3    Sig: INHALE 2 PUFFS INTO THE LUNGS EVERY 4 (FOUR) HOURS AS NEEDED (SHORTNESS OF BREATH).     Pulmonology:  Beta Agonists Failed - 05/16/2021 11:29 AM      Failed - One inhaler should last at least one month. If the patient is requesting refills earlier, contact the patient to check for uncontrolled symptoms.      Passed - Valid encounter within last 12 months    Recent Outpatient Visits          Today Routine general medical examination at a health care facility   [facility] Caro Laroche, DO   3 months ago History of COVID-19   [facility] Alba Cory, MD   1 year ago Senile purpura [facility])   [facility] Alba Cory, MD   1 year ago Essential hypertension   [facility] Alba Cory, MD   1 year ago [facility]lobular emphyse[facility])   [facility] Alba Cory, MD      Future Appointments            In 1 year Alba Cory, MD [facility], [facility]

## 2021-05-16 NOTE — Patient Instructions (Signed)
It was great to see you!  Our plans for today:  - Call to schedule your lung cancer screening CT scan.  - Bring a copy of your advanced directives to the clinic whenever convenient.  - Use the inhaler only as needed for shortness of breath. - We are checking some labs today, we will release these results to your MyChart.  Take care and seek immediate care sooner if you develop any concerns.   Dr. Linwood Dibbles  Things to do to keep yourself healthy  - Exercise at least 30-45 minutes a day, 3-4 days a week.  - Eat a low-fat diet with lots of fruits and vegetables, up to 7-9 servings per day.  - Seatbelts can save your life. Wear them always.  - Smoke detectors on every level of your home, check batteries every year.  - Eye Doctor - have an eye exam every 1-2 years  - Safe sex - if you may be exposed to STDs, use a condom.  - Alcohol -  If you drink, do it moderately, less than 2 drinks per day.  - Health Care Power of Attorney. Choose someone to speak for you if you are not able. https://www.prepareforyourcare.org is a great website to help you navigate this. - Depression is common in our stressful world.If you're feeling down or losing interest in things you normally enjoy, please come in for a visit.  - Violence - If anyone is threatening or hurting you, please call immediately.

## 2021-05-16 NOTE — Progress Notes (Signed)
BP 126/82   Pulse 71   Temp 98 F (36.7 C)   Resp 16   Ht 6\' 1"  (1.854 m)   Wt 168 lb 12.8 oz (76.6 kg)   SpO2 97%   BMI 22.27 kg/m    Subjective:    Patient ID: Aaron Ross, male    DOB: 14-Jun-1956, 65 y.o.   MRN: [id number]  HPI: Aaron Ross is a 65 y.o. male presenting on 05/16/2021 for comprehensive medical examination. Current medical complaints include:none  Hypertension: - Medications: valsartan - Compliance: good - Checking BP at home: yes, occasionally - Denies any LE edema, medication SEs, or symptoms of hypotension  Emphysema/COPD - Medications: none - Compliance: n/a - Exacerbations in last 6 months: CAP in 01/2021, COVID-19 in 12/2020 - CAT score: 8 - former smoker, 86 pack years - undergoing lung cancer screening, f/u CT later this month  GERD - Meds: protonix daily - Symptoms:  heartburn, belching.  - denies dysphagia has not lost weight denies melena, hematochezia, hematemesis, and coffee ground emesis.   HLD - medications: crestor - compliance: good - medication SEs: none  He currently lives with: wife, dog  Depression Screen done today and results listed below:  Depression screen [facility] 2/9 05/16/2021 02/15/2021 05/10/2020 05/10/2020 11/03/2019  Decreased Interest 0 0 0 0 0  Down, Depressed, Hopeless 0 0 0 0 0  PHQ - 2 Score 0 0 0 0 0  Altered sleeping 0 - 1 - 0  Tired, decreased energy 0 - 0 - 0  Change in appetite 0 - 1 - 0  Feeling bad or failure about yourself  0 - 0 - 0  Trouble concentrating 0 - 0 - 0  Moving slowly or fidgety/restless 0 - 0 - 0  Suicidal thoughts 0 - 0 - 0  PHQ-9 Score 0 - 2 - 0  Difficult doing work/chores Not difficult at all - Not difficult at all - Not difficult at all  Some recent data might be hidden     Past Medical History:  Past Medical History:  Diagnosis Date   COPD (chronic obstructive pulmonary disease) (HCC)    GERD (gastroesophageal reflux disease)    Hx of nonmelanoma skin cancer    Treated in  [place] several >25 years ago   Hypertension    Sleep apnea     Surgical History:  Past Surgical History:  Procedure Laterality Date   COSMETIC SURGERY     EYE SURGERY     HERNIA REPAIR      Medications:  Current Outpatient Medications on File Prior to Visit  Medication Sig   aspirin 81 MG EC tablet TAKE 1 TABLET BY MOUTH EVERY DAY   Cholecalciferol (VITAMIN D3) 50 MCG (2000 UT) CAPS Take 1 capsule (2,000 Units total) by mouth daily.   pantoprazole (PROTONIX) 40 MG tablet TAKE 1 TABLET BY MOUTH EVERY DAY   valsartan (DIOVAN) 160 MG tablet TAKE 1 TABLET BY MOUTH EVERY DAY   [DISCONTINUED] losartan (COZAAR) 100 MG tablet Take 1 tablet (100 mg total) by mouth daily.   No current facility-administered medications on file prior to visit.    Allergies:  No Known Allergies  Social History:  Social History   Socioeconomic History   Marital status: Married    Spouse name: Not on file   Number of children: 0   Years of education: Not on file   Highest education level: 9th grade  Occupational History   Occupation: Customer service manager   Tobacco Use  Smoking status: Former    Packs/day: 2.00    Years: 43.00    Pack years: 86.00    Types: Cigarettes    Start date: 06/25/1972    Quit date: 07/15/2017    Years since quitting: 3.8   Smokeless tobacco: Never  Vaping Use   Vaping Use: Never used  Substance and Sexual Activity   Alcohol use: Yes    Alcohol/week: 8.0 standard drinks    Types: 8 Standard drinks or equivalent per week   Drug use: No   Sexual activity: Yes    Partners: Female    Birth control/protection: Surgical  Other Topics Concern   Not on file  Social History Narrative   Divorced, never had children, remarried January 2020, she is from [place]    He works full time in [place], Press photographer   He is originally from [place] Moved here with parents and siblings when he was 16.    Siblings still in [place]   Social Determinants of Health   Financial Resource Strain: Low  Risk    Difficulty of Paying Living Expenses: Not very hard  Food Insecurity: No Food Insecurity   Worried About Programme researcher, broadcasting/film/video in the Last Year: Never true   Barista in the Last Year: Never true  Transportation Needs: No Transportation Needs   Lack of Transportation (Medical): No   Lack of Transportation (Non-Medical): No  Physical Activity: Insufficiently Active   Days of Exercise per Week: 3 days   Minutes of Exercise per Session: 20 min  Stress: No Stress Concern Present   Feeling of Stress : Not at all  Social Connections: Socially Isolated   Frequency of Communication with Friends and Family: Once a week   Frequency of Social Gatherings with Friends and Family: Never   Attends Religious Services: Never   Diplomatic Services operational officer: No   Attends Engineer, structural: Never   Marital Status: Married  Catering manager Violence: Not At Risk   Fear of Current or Ex-Partner: No   Emotionally Abused: No   Physically Abused: No   Sexually Abused: No   Social History   Tobacco Use  Smoking Status Former   Packs/day: 2.00   Years: 43.00   Pack years: 86.00   Types: Cigarettes   Start date: 06/25/1972   Quit date: 07/15/2017   Years since quitting: 3.8  Smokeless Tobacco Never   Social History   Substance and Sexual Activity  Alcohol Use Yes   Alcohol/week: 8.0 standard drinks   Types: 8 Standard drinks or equivalent per week    Family History:  Family History  Problem Relation Age of Onset   Hypertension Father    Prostate cancer Neg Hx    Bladder Cancer Neg Hx    Kidney cancer Neg Hx     Past medical history, surgical history, medications, allergies, family history and social history reviewed with patient today and changes made to appropriate areas of the chart.      Objective:    BP 126/82   Pulse 71   Temp 98 F (36.7 C)   Resp 16   Ht 6\' 1"  (1.854 m)   Wt 168 lb 12.8 oz (76.6 kg)   SpO2 97%   BMI 22.27 kg/m   Wt  Readings from Last 3 Encounters:  05/16/21 168 lb 12.8 oz (76.6 kg)  02/20/21 165 lb (74.8 kg)  02/15/21 163 lb (73.9 kg)    Physical Exam Vitals  reviewed.  Constitutional:      Appearance: Normal appearance.  HENT:     Head: Normocephalic.     Right Ear: External ear normal.     Left Ear: External ear normal.  Eyes:     Extraocular Movements: Extraocular movements intact.  Cardiovascular:     Rate and Rhythm: Normal rate and regular rhythm.     Heart sounds: Normal heart sounds. No murmur heard. Pulmonary:     Effort: Pulmonary effort is normal. No respiratory distress.     Breath sounds: Normal breath sounds. No wheezing.  Abdominal:     General: Bowel sounds are normal.     Palpations: Abdomen is soft.     Tenderness: There is no abdominal tenderness.  Musculoskeletal:        General: Normal range of motion.     Cervical back: Normal range of motion.     Right lower leg: No edema.     Left lower leg: No edema.  Lymphadenopathy:     Cervical: No cervical adenopathy.  Skin:    General: Skin is warm and dry.  Neurological:     Mental Status: [name] and oriented to person, place, and time. Mental status is at baseline.  Psychiatric:        Mood and Affect: Mood normal.        Behavior: Behavior normal.    Results for orders placed or performed during the hospital encounter of 01/30/21  Basic metabolic panel  Result Value Ref Range   Sodium 136 135 - 145 mmol/L   Potassium 3.6 3.5 - 5.1 mmol/L   Chloride 101 98 - 111 mmol/L   CO2 22 22 - 32 mmol/L   Glucose, Bld 126 (H) 70 - 99 mg/dL   BUN 15 8 - 23 mg/dL   Creatinine, Ser 4.09 0.61 - 1.24 mg/dL   Calcium 8.9 8.9 - 81.1 mg/dL   GFR, Estimated >91 >47 mL/min   Anion gap 13 5 - 15  CBC  Result Value Ref Range   WBC 10.9 (H) 4.0 - 10.5 K/uL   RBC 4.82 4.22 - 5.81 MIL/uL   Hemoglobin 15.3 13.0 - 17.0 g/dL   HCT 82.9 56.2 - 13.0 %   MCV 92.3 80.0 - 100.0 fL   MCH 31.7 26.0 - 34.0 pg   MCHC 34.4 30.0 - 36.0  g/dL   RDW 86.5 78.4 - 69.6 %   Platelets 256 150 - 400 K/uL   nRBC 0.0 0.0 - 0.2 %  Hepatic function panel  Result Value Ref Range   Total Protein 7.8 6.5 - 8.1 g/dL   Albumin 3.5 3.5 - 5.0 g/dL   AST 24 15 - 41 U/L   ALT 21 0 - 44 U/L   Alkaline Phosphatase 69 38 - 126 U/L   Total Bilirubin 1.0 0.3 - 1.2 mg/dL   Bilirubin, Direct 0.2 0.0 - 0.2 mg/dL   Indirect Bilirubin 0.8 0.3 - 0.9 mg/dL  Troponin I (High Sensitivity)  Result Value Ref Range   Troponin I (High Sensitivity) 9 <18 ng/L      Assessment & Plan:   Problem List Items Addressed This Visit       Cardiovascular and Mediastinum   HTN (hypertension)   Relevant Medications   rosuvastatin (CRESTOR) 10 MG tablet     Respiratory   COPD (chronic obstructive pulmonary disease) (HCC)   Relevant Medications   Albuterol Sulfate (PROAIR RESPICLICK) 108 (90 Base) MCG/ACT AEPB   Other  Relevant Orders   Pneumococcal conjugate vaccine 20-valent (Prevnar 20)     Digestive   GERD (gastroesophageal reflux disease)     Other   Hyperglycemia - Primary   Relevant Orders   Hemoglobin A1c   Hyperlipidemia   Relevant Medications   rosuvastatin (CRESTOR) 10 MG tablet   Other Relevant Orders   Lipid panel   Lung nodules   Other Visit Diagnoses     Screening for colon cancer       Relevant Orders   Cologuard   Screening for prostate cancer       Relevant Orders   PSA        LABORATORY TESTING:  Health maintenance labs ordered today as discussed above.   The natural history of prostate cancer and ongoing controversy regarding screening and potential treatment outcomes of prostate cancer has been discussed with the patient. The meaning of a false positive PSA and a false negative PSA has been discussed. He indicates understanding of the limitations of this screening test and wishes to proceed with screening PSA testing.   IMMUNIZATIONS:   - Tdap: Tetanus vaccination status reviewed: last tetanus booster within 10  years. - Influenza: Up to date - Pneumococcal: Not applicable - HPV: Not applicable - Shingrix vaccine: Up to date - COVID vaccine: has received 3 doses of mRNA vaccine  SCREENING: - Colonoscopy:  due for cologuard testing, ordered Discussed with patient purpose of the colonoscopy is to detect colon cancer at curable precancerous or early stages   - AAA Screening: Not applicable  - Lung cancer screening: undergoing screening, f/u scan later this month  Hep C Screening: UTD STD testing and prevention (HIV/chl/gon/syphilis): n/a Sexual History: currently sexually active, monogamous Incontinence Symptoms: none  PATIENT COUNSELING:    Advanced Care Planning: A voluntary discussion about advance care planning including the explanation and discussion of advance directives.  Discussed health care proxy and Living will, and the patient was able to identify a health care proxy as wife, Elray Mcgregor.  Patient does have a living will at present time. If patient does have living will, I have requested they bring this to the clinic to be scanned in to their chart.  Sexuality: Discussed sexually transmitted diseases, partner selection, use of condoms, avoidance of unintended pregnancy  and contraceptive alternatives.   Advised to avoid cigarette smoking.  I discussed with the patient that most people either abstain from alcohol or drink within safe limits (<=14/week and <=4 drinks/occasion for males, <=7/weeks and <= 3 drinks/occasion for females) and that the risk for alcohol disorders and other health effects rises proportionally with the number of drinks per week and how often a drinker exceeds daily limits.  Discussed cessation/primary prevention of drug use and availability of treatment for abuse.   Diet: Encouraged to adjust caloric intake to maintain  or achieve ideal body weight, to reduce intake of dietary saturated fat and total fat, to limit sodium intake by avoiding high sodium foods  and not adding table salt, and to maintain adequate dietary potassium and calcium preferably from fresh fruits, vegetables, and low-fat dairy products.    Stressed the importance of regular exercise.  Injury prevention: Discussed safety belts, safety helmets, smoke detector, smoking near bedding or upholstery.   Dental health: Discussed importance of regular tooth brushing, flossing, and dental visits.   Follow up plan: NEXT PREVENTATIVE PHYSICAL DUE IN 1 YEAR. No follow-ups on file.

## 2021-05-17 ENCOUNTER — Telehealth: Payer: Self-pay

## 2021-05-17 LAB — LIPID PANEL
Cholesterol: 182 mg/dL (ref <200)
HDL: 84 mg/dL (ref >=40)
LDL Cholesterol (Calc): 79 mg/dL (calc)
Non-HDL Cholesterol (Calc): 98 mg/dL (calc) (ref <130)
Total CHOL/HDL Ratio: 2.2 (calc) (ref <5.0)
Triglycerides: 100 mg/dL (ref <150)

## 2021-05-17 LAB — HEMOGLOBIN A1C
Hgb A1c MFr Bld: 5.5 % of total Hgb (ref <5.7)
Mean Plasma Glucose: 111 mg/dL
eAG (mmol/L): 6.2 mmol/L

## 2021-05-17 LAB — PSA: PSA: 1.42 ng/mL (ref <=4.00)

## 2021-05-17 NOTE — Telephone Encounter (Signed)
Copied from CRM [id number]. Topic: General - Other >> May 17, 2021  9:12 AM Wyonia Hough E wrote: Reason for CRM: FYI /Pt called to update his covid vaccine record / his 3rd shot is missing / the date of this was 12.10.2021/ Pt received this at [facility] / [employer] lot# [id number]

## 2021-05-17 NOTE — Telephone Encounter (Signed)
Updated record.

## 2021-05-22 ENCOUNTER — Other Ambulatory Visit: Payer: Self-pay | Admitting: Family Medicine

## 2021-05-22 DIAGNOSIS — K219 Gastro-esophageal reflux disease without esophagitis: Secondary | ICD-10-CM

## 2021-05-22 DIAGNOSIS — I10 Essential (primary) hypertension: Secondary | ICD-10-CM

## 2021-06-09 ENCOUNTER — Ambulatory Visit
Admission: RE | Admit: 2021-06-09 | Discharge: 2021-06-09 | Disposition: A | Discharge status: Home / Self Care | Payer: BC Managed Care – PPO | Source: Ambulatory Visit | Attending: Acute Care | Admitting: Acute Care

## 2021-06-09 ENCOUNTER — Other Ambulatory Visit: Payer: Self-pay

## 2021-06-09 DIAGNOSIS — Z87891 Personal history of nicotine dependence: Secondary | ICD-10-CM | POA: Insufficient documentation

## 2021-06-15 LAB — COLOGUARD: COLOGUARD: NEGATIVE

## 2021-07-03 ENCOUNTER — Other Ambulatory Visit: Payer: Self-pay | Admitting: Acute Care

## 2021-07-03 DIAGNOSIS — Z87891 Personal history of nicotine dependence: Secondary | ICD-10-CM

## 2021-10-21 IMAGING — CT CT CHEST LCS NODULE FOLLOW-UP W/O CM
2 of 5 series · 15 of 40 positions shown, 18 images · non-contrast
Comparison: PET-CT 06/04/2019 lung cancer screening CT 05/19/2019.

CLINICAL DATA: 63-year-old male with 86 pack-year history of
smoking. Lung cancer screening.

EXAM:
CT CHEST WITHOUT CONTRAST FOR LUNG CANCER SCREENING NODULE FOLLOW-UP
TECHNIQUE: Multidetector CT imaging of the chest was performed following the
standard protocol without IV contrast.

[Series 3: lung lcs f/u 1.00 · axial · 0.75mm/px · z∈[-1221,-886]mm · 12 of 371 slices shown, 15 images]
[im 18/371  mediastinal]
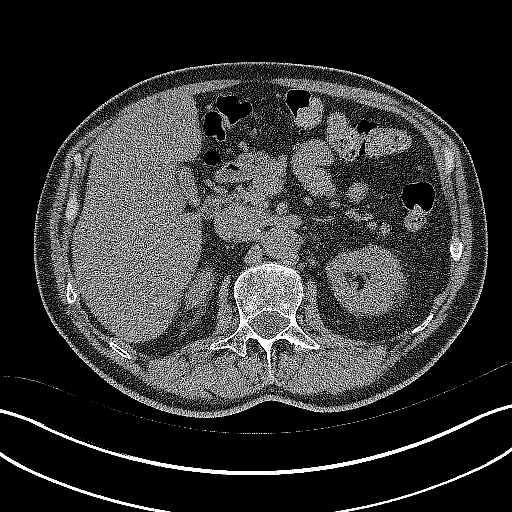
[im 18/371  lung]
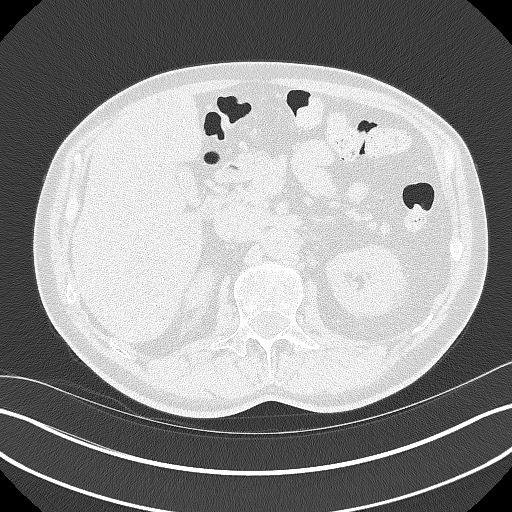
[im 53/371  lung]
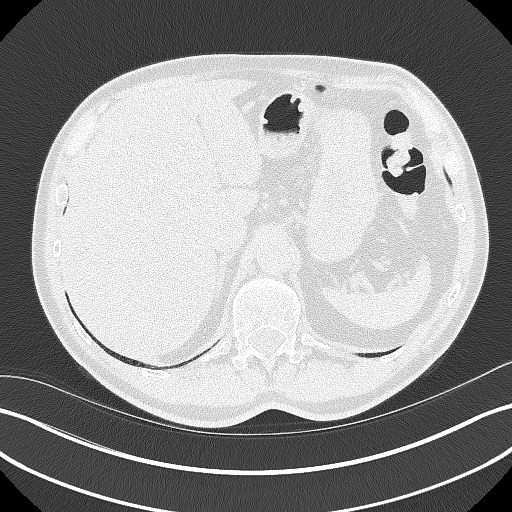
[im 89/371  lung]
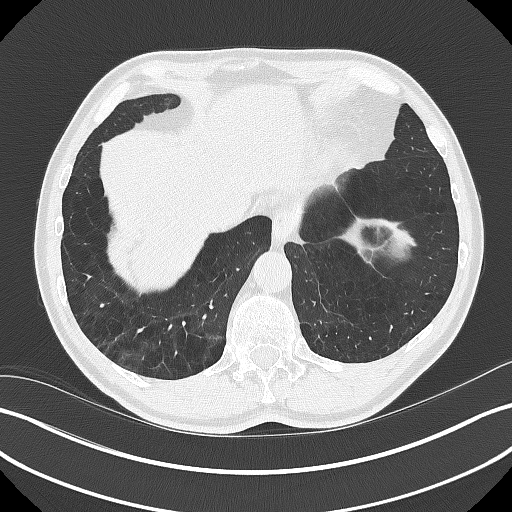
[im 106/371  lung]
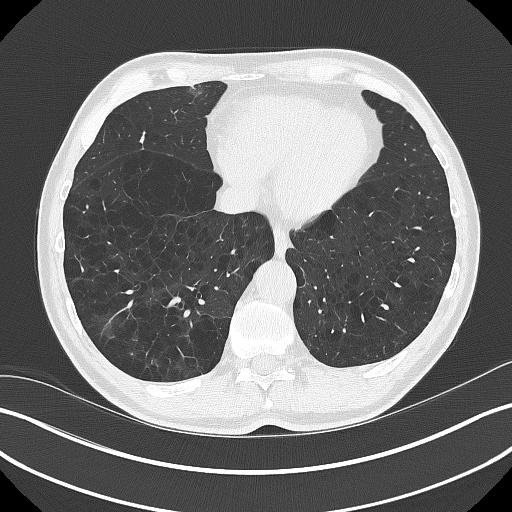
[im 141/371  mediastinal]
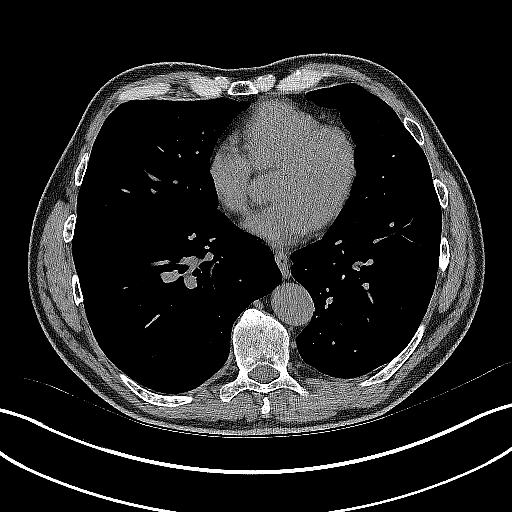
[im 141/371  lung]
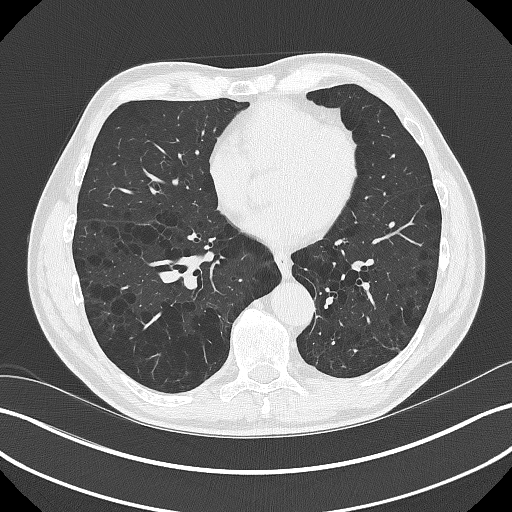
[im 177/371  lung]
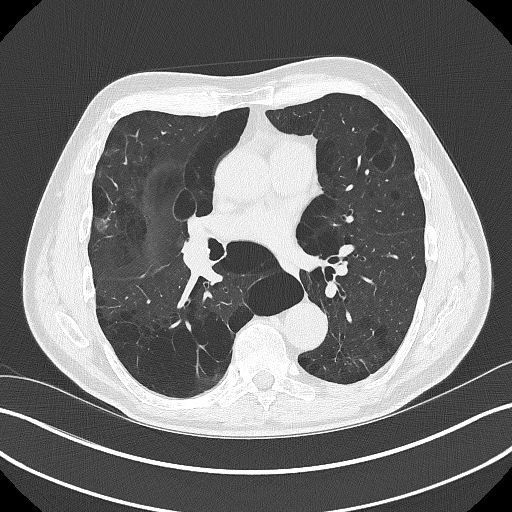
[im 194/371  lung]
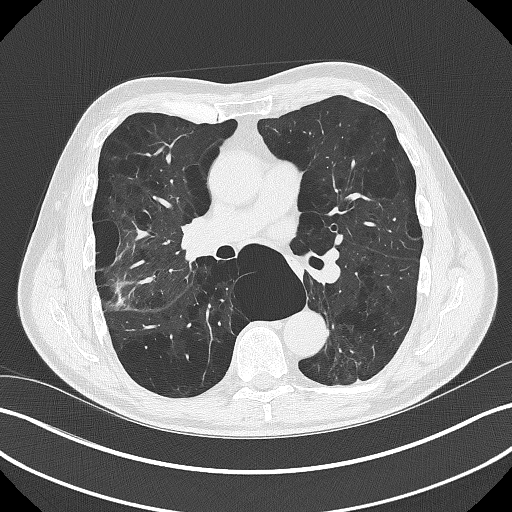
[im 230/371  lung]
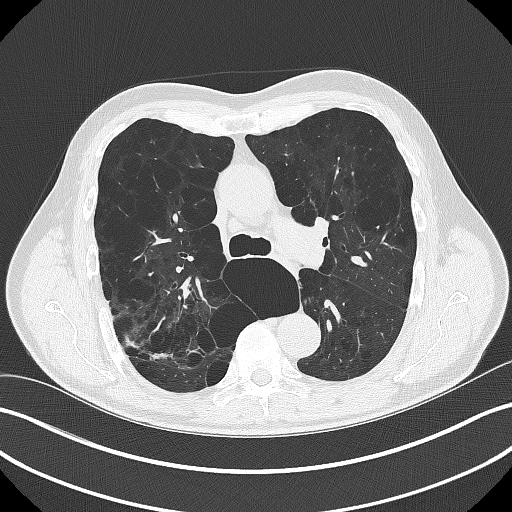
[im 265/371  mediastinal]
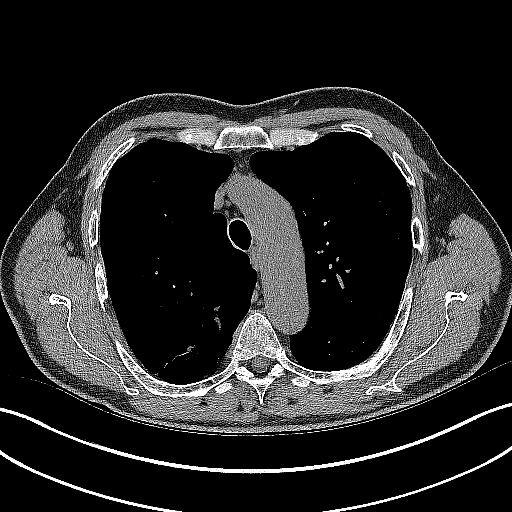
[im 265/371  lung]
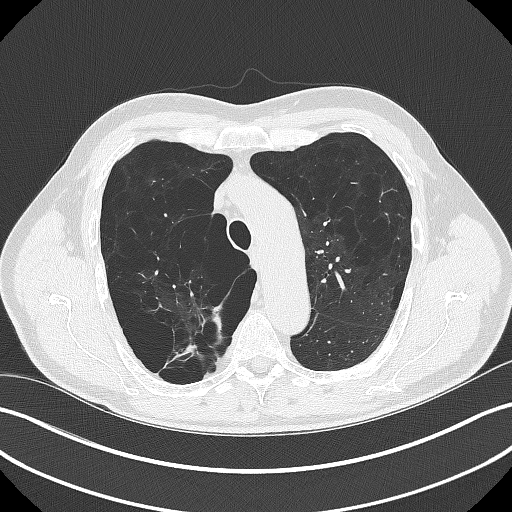
[im 282/371  lung]
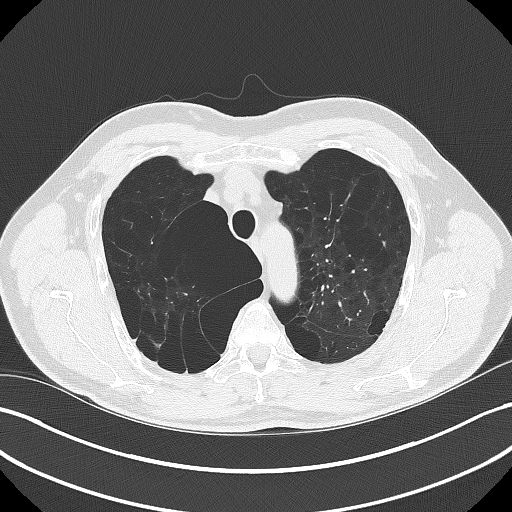
[im 318/371  lung]
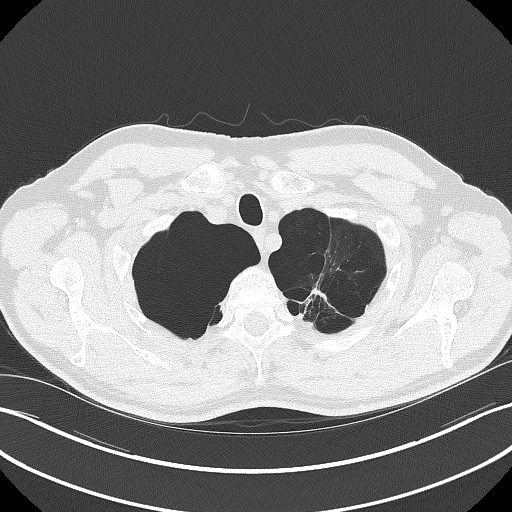
[im 353/371  lung]
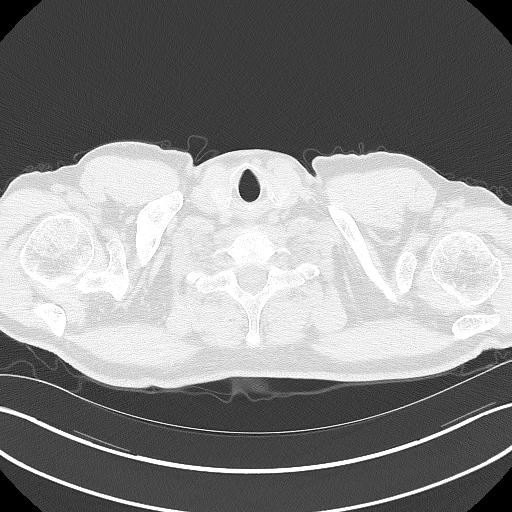

[Series 4: lcs f/u 1.00 cor · coronal · 0.73mm/px · 3 of 356 slices shown]
[im 72/356  lung]
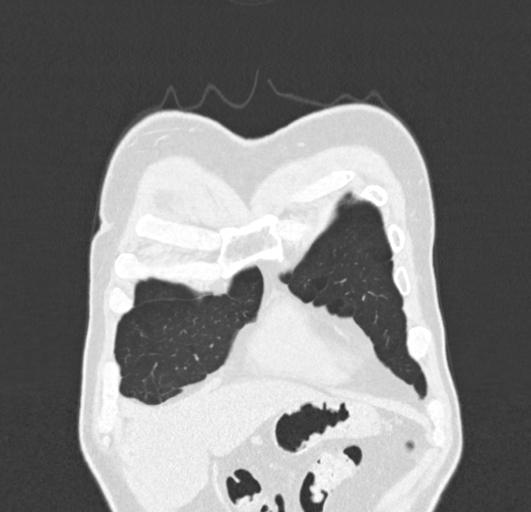
[im 143/356  lung]
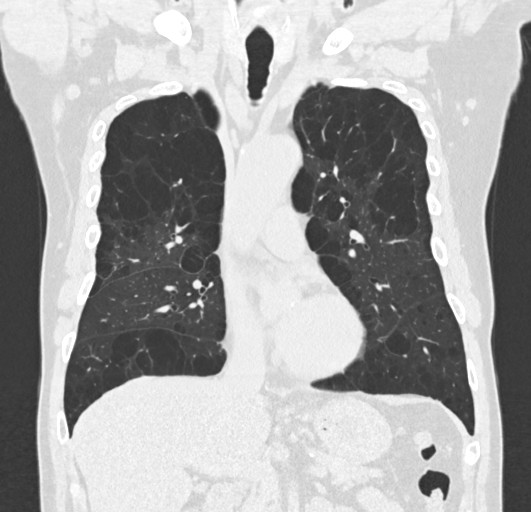
[im 214/356  lung]
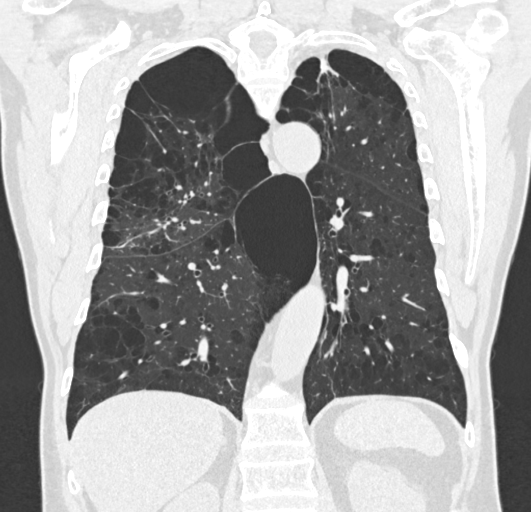

[15 of 40 positions shown; findings below may reference images not displayed]

FINDINGS: Cardiovascular: The heart size is normal. No substantial pericardial
effusion. Coronary artery calcification is evident. Atherosclerotic
calcification is noted in the wall of the thoracic aorta.

Mediastinum/Nodes: No mediastinal lymphadenopathy. No evidence for
gross hilar lymphadenopathy although assessment is limited by the
lack of intravenous contrast on today's study. The esophagus has
normal imaging features. There is no axillary lymphadenopathy.

Lungs/Pleura: Centrilobular and paraseptal emphysema evident.
Bullous change noted upper lungs bilaterally, right greater than
left.

The irregular posterior right upper lobe pulmonary lesion is stable
since prior screening CT with volume derived equivalent diameter of
10.2 mm today. This lesion showed low level FDG uptake without
hypermetabolism on previous PET-CT.

The other dominant lesion in the posterior left costophrenic sulcus
measures slightly smaller today at volume derived equivalent
diameter of 14.9 mm compared to 17.9 mm previously. Several other
scattered tiny pulmonary nodules are stable in the interval. No new
suspicious pulmonary nodule or mass.

Architectural distortion and scarring in the posterior right upper
lobe is stable. No focal airspace consolidation. No pleural
effusion.

Upper Abdomen: Unremarkable

Musculoskeletal: No worrisome lytic or sclerotic osseous
abnormality.
IMPRESSION: 1. Lung-RADS 2, benign appearance or behavior. Continue annual
screening with low-dose chest CT without contrast in 12 months.
2. No new suspicious pulmonary nodule or mass.
3.  Emphysema ([id number]-9F5.M) and Aortic Atherosclerosis (T46UA-170.0)

## 2021-12-07 ENCOUNTER — Other Ambulatory Visit: Payer: Self-pay

## 2021-12-07 ENCOUNTER — Emergency Department: Payer: 59

## 2021-12-07 ENCOUNTER — Inpatient Hospital Stay
Admission: EM | Admit: 2021-12-07 | Discharge: 2021-12-09 | DRG: 312 | Disposition: A | Discharge status: Home / Self Care | Payer: 59 | Attending: Internal Medicine | Admitting: Internal Medicine

## 2021-12-07 DIAGNOSIS — I110 Hypertensive heart disease with heart failure: Secondary | ICD-10-CM | POA: Diagnosis present

## 2021-12-07 DIAGNOSIS — E785 Hyperlipidemia, unspecified: Secondary | ICD-10-CM | POA: Diagnosis present

## 2021-12-07 DIAGNOSIS — Z87891 Personal history of nicotine dependence: Secondary | ICD-10-CM

## 2021-12-07 DIAGNOSIS — Z7982 Long term (current) use of aspirin: Secondary | ICD-10-CM

## 2021-12-07 DIAGNOSIS — I951 Orthostatic hypotension: Principal | ICD-10-CM | POA: Diagnosis present

## 2021-12-07 DIAGNOSIS — J9601 Acute respiratory failure with hypoxia: Secondary | ICD-10-CM | POA: Diagnosis present

## 2021-12-07 DIAGNOSIS — Z8616 Personal history of COVID-19: Secondary | ICD-10-CM

## 2021-12-07 DIAGNOSIS — I5032 Chronic diastolic (congestive) heart failure: Secondary | ICD-10-CM | POA: Diagnosis present

## 2021-12-07 DIAGNOSIS — Z85828 Personal history of other malignant neoplasm of skin: Secondary | ICD-10-CM

## 2021-12-07 DIAGNOSIS — S2239XA Fracture of one rib, unspecified side, initial encounter for closed fracture: Secondary | ICD-10-CM | POA: Diagnosis present

## 2021-12-07 DIAGNOSIS — F1020 Alcohol dependence, uncomplicated: Secondary | ICD-10-CM | POA: Diagnosis present

## 2021-12-07 DIAGNOSIS — S2242XA Multiple fractures of ribs, left side, initial encounter for closed fracture: Secondary | ICD-10-CM | POA: Diagnosis present

## 2021-12-07 DIAGNOSIS — G4730 Sleep apnea, unspecified: Secondary | ICD-10-CM | POA: Diagnosis present

## 2021-12-07 DIAGNOSIS — Z8249 Family history of ischemic heart disease and other diseases of the circulatory system: Secondary | ICD-10-CM

## 2021-12-07 DIAGNOSIS — Z79899 Other long term (current) drug therapy: Secondary | ICD-10-CM

## 2021-12-07 DIAGNOSIS — K219 Gastro-esophageal reflux disease without esophagitis: Secondary | ICD-10-CM | POA: Diagnosis present

## 2021-12-07 DIAGNOSIS — J449 Chronic obstructive pulmonary disease, unspecified: Secondary | ICD-10-CM | POA: Diagnosis present

## 2021-12-07 DIAGNOSIS — R55 Syncope and collapse: Secondary | ICD-10-CM | POA: Diagnosis not present

## 2021-12-07 DIAGNOSIS — Y906 Blood alcohol level of 120-199 mg/100 ml: Secondary | ICD-10-CM | POA: Diagnosis present

## 2021-12-07 DIAGNOSIS — W19XXXA Unspecified fall, initial encounter: Secondary | ICD-10-CM | POA: Diagnosis present

## 2021-12-07 DIAGNOSIS — E860 Dehydration: Secondary | ICD-10-CM | POA: Diagnosis present

## 2021-12-07 DIAGNOSIS — J439 Emphysema, unspecified: Secondary | ICD-10-CM | POA: Diagnosis present

## 2021-12-07 DIAGNOSIS — F10229 Alcohol dependence with intoxication, unspecified: Secondary | ICD-10-CM | POA: Diagnosis present

## 2021-12-07 LAB — COMPREHENSIVE METABOLIC PANEL
ALT: 38 U/L (ref 0–44)
AST: 43 U/L — ABNORMAL HIGH (ref 15–41)
Albumin: 4.3 g/dL (ref 3.5–5.0)
Alkaline Phosphatase: 70 U/L (ref 38–126)
Anion gap: 9 (ref 5–15)
BUN: 16 mg/dL (ref 8–23)
CO2: 26 mmol/L (ref 22–32)
Calcium: 9.2 mg/dL (ref 8.9–10.3)
Chloride: 107 mmol/L (ref 98–111)
Creatinine, Ser: 1.16 mg/dL (ref 0.61–1.24)
GFR, Estimated: >60 mL/min (ref >60)
Glucose, Bld: 126 mg/dL — ABNORMAL HIGH (ref 70–99)
Potassium: 3.2 mmol/L — ABNORMAL LOW (ref 3.5–5.1)
Sodium: 142 mmol/L (ref 135–145)
Total Bilirubin: 0.9 mg/dL (ref 0.3–1.2)
Total Protein: 7.6 g/dL (ref 6.5–8.1)

## 2021-12-07 LAB — TROPONIN I (HIGH SENSITIVITY): Troponin I (High Sensitivity): 7 ng/L (ref <18)

## 2021-12-07 LAB — CBC
HCT: 45.6 % (ref 39.0–52.0)
Hemoglobin: 14.8 g/dL (ref 13.0–17.0)
MCH: 31 pg (ref 26.0–34.0)
MCHC: 32.5 g/dL (ref 30.0–36.0)
MCV: 95.4 fL (ref 80.0–100.0)
Platelets: 228 10*3/uL (ref 150–400)
RBC: 4.78 MIL/uL (ref 4.22–5.81)
RDW: 13.2 % (ref 11.5–15.5)
WBC: 6.8 10*3/uL (ref 4.0–10.5)
nRBC: 0 % (ref 0.0–0.2)

## 2021-12-07 LAB — ETHANOL: Alcohol, Ethyl (B): 156 mg/dL — ABNORMAL HIGH (ref <10)

## 2021-12-07 MED ORDER — MORPHINE SULFATE (PF) 4 MG/ML IV SOLN
4.0000 mg | Freq: Once | INTRAVENOUS | Status: AC
  Administered 2021-12-08: 4 mg via INTRAVENOUS
  Filled 2021-12-07: qty 1

## 2021-12-07 MED ORDER — ONDANSETRON HCL 4 MG/2ML IJ SOLN
4.0000 mg | Freq: Once | INTRAMUSCULAR | Status: AC
  Administered 2021-12-08: 4 mg via INTRAVENOUS
  Filled 2021-12-07: qty 2

## 2021-12-07 MED ORDER — IOHEXOL 350 MG/ML SOLN
50.0000 mL | Freq: Once | INTRAVENOUS | Status: AC | PRN
  Administered 2021-12-07: 50 mL via INTRAVENOUS

## 2021-12-07 MED ORDER — SODIUM CHLORIDE 0.9 % IV BOLUS (SEPSIS)
1000.0000 mL | Freq: Once | INTRAVENOUS | Status: AC
  Administered 2021-12-08: 1000 mL via INTRAVENOUS

## 2021-12-07 NOTE — ED Triage Notes (Signed)
Pt presents to ER from home today c/o left side rib pain after passing out x3 within 30 minutes.  Pt states episode happened appx 1hr ago.  Pt c/o left side rib pain at this time.  Denies other injuries.  Pt states he does not remember what happens.  Pt does state that he drinks appx 3 shots of bourbon every day and did drink prior to this happening.  Pt is A&O x4 at this time in NAD in triage.

## 2021-12-08 ENCOUNTER — Encounter: Payer: Self-pay | Admitting: Internal Medicine

## 2021-12-08 ENCOUNTER — Observation Stay
Admit: 2021-12-08 | Discharge: 2021-12-08 | Disposition: A | Discharge status: Home / Self Care | Payer: 59 | Attending: Internal Medicine | Admitting: Internal Medicine

## 2021-12-08 DIAGNOSIS — I5032 Chronic diastolic (congestive) heart failure: Secondary | ICD-10-CM | POA: Diagnosis present

## 2021-12-08 DIAGNOSIS — E860 Dehydration: Secondary | ICD-10-CM | POA: Diagnosis present

## 2021-12-08 DIAGNOSIS — E785 Hyperlipidemia, unspecified: Secondary | ICD-10-CM | POA: Diagnosis present

## 2021-12-08 DIAGNOSIS — Z79899 Other long term (current) drug therapy: Secondary | ICD-10-CM | POA: Diagnosis not present

## 2021-12-08 DIAGNOSIS — J9601 Acute respiratory failure with hypoxia: Secondary | ICD-10-CM | POA: Diagnosis present

## 2021-12-08 DIAGNOSIS — K219 Gastro-esophageal reflux disease without esophagitis: Secondary | ICD-10-CM | POA: Diagnosis present

## 2021-12-08 DIAGNOSIS — J439 Emphysema, unspecified: Secondary | ICD-10-CM | POA: Diagnosis present

## 2021-12-08 DIAGNOSIS — I951 Orthostatic hypotension: Secondary | ICD-10-CM | POA: Diagnosis present

## 2021-12-08 DIAGNOSIS — Z7982 Long term (current) use of aspirin: Secondary | ICD-10-CM | POA: Diagnosis not present

## 2021-12-08 DIAGNOSIS — I110 Hypertensive heart disease with heart failure: Secondary | ICD-10-CM | POA: Diagnosis present

## 2021-12-08 DIAGNOSIS — Y906 Blood alcohol level of 120-199 mg/100 ml: Secondary | ICD-10-CM | POA: Diagnosis present

## 2021-12-08 DIAGNOSIS — Z87891 Personal history of nicotine dependence: Secondary | ICD-10-CM | POA: Diagnosis not present

## 2021-12-08 DIAGNOSIS — R55 Syncope and collapse: Secondary | ICD-10-CM | POA: Diagnosis present

## 2021-12-08 DIAGNOSIS — W19XXXA Unspecified fall, initial encounter: Secondary | ICD-10-CM | POA: Diagnosis present

## 2021-12-08 DIAGNOSIS — Z8249 Family history of ischemic heart disease and other diseases of the circulatory system: Secondary | ICD-10-CM | POA: Diagnosis not present

## 2021-12-08 DIAGNOSIS — S2242XA Multiple fractures of ribs, left side, initial encounter for closed fracture: Secondary | ICD-10-CM

## 2021-12-08 DIAGNOSIS — G4730 Sleep apnea, unspecified: Secondary | ICD-10-CM | POA: Diagnosis present

## 2021-12-08 DIAGNOSIS — Z8616 Personal history of COVID-19: Secondary | ICD-10-CM | POA: Diagnosis not present

## 2021-12-08 DIAGNOSIS — F10229 Alcohol dependence with intoxication, unspecified: Secondary | ICD-10-CM | POA: Diagnosis present

## 2021-12-08 DIAGNOSIS — S2239XA Fracture of one rib, unspecified side, initial encounter for closed fracture: Secondary | ICD-10-CM | POA: Diagnosis present

## 2021-12-08 DIAGNOSIS — Z85828 Personal history of other malignant neoplasm of skin: Secondary | ICD-10-CM | POA: Diagnosis not present

## 2021-12-08 LAB — ECHOCARDIOGRAM COMPLETE
AR max vel: 2.54 cm2
AV Area VTI: 2.28 cm2
AV Area mean vel: 2.2 cm2
AV Mean grad: 2 mmHg
AV Peak grad: 3.8 mmHg
Ao pk vel: 0.97 m/s
Area-P 1/2: 2.46 cm2
Height: 73 in
MV VTI: 1.82 cm2
S' Lateral: 2.8 cm
Weight: 2400 oz

## 2021-12-08 LAB — CBC
HCT: 40.7 % (ref 39.0–52.0)
Hemoglobin: 13.4 g/dL (ref 13.0–17.0)
MCH: 30.9 pg (ref 26.0–34.0)
MCHC: 32.9 g/dL (ref 30.0–36.0)
MCV: 94 fL (ref 80.0–100.0)
Platelets: 207 10*3/uL (ref 150–400)
RBC: 4.33 MIL/uL (ref 4.22–5.81)
RDW: 13 % (ref 11.5–15.5)
WBC: 9.2 10*3/uL (ref 4.0–10.5)
nRBC: 0 % (ref 0.0–0.2)

## 2021-12-08 LAB — BASIC METABOLIC PANEL
Anion gap: 9 (ref 5–15)
BUN: 15 mg/dL (ref 8–23)
CO2: 22 mmol/L (ref 22–32)
Calcium: 8.4 mg/dL — ABNORMAL LOW (ref 8.9–10.3)
Chloride: 108 mmol/L (ref 98–111)
Creatinine, Ser: 0.91 mg/dL (ref 0.61–1.24)
GFR, Estimated: >60 mL/min (ref >60)
Glucose, Bld: 134 mg/dL — ABNORMAL HIGH (ref 70–99)
Potassium: 3.8 mmol/L (ref 3.5–5.1)
Sodium: 139 mmol/L (ref 135–145)

## 2021-12-08 LAB — URINALYSIS, ROUTINE W REFLEX MICROSCOPIC
Bilirubin Urine: NEGATIVE
Glucose, UA: 50 mg/dL — AB
Hgb urine dipstick: NEGATIVE
Ketones, ur: NEGATIVE mg/dL
Leukocytes,Ua: NEGATIVE
Nitrite: NEGATIVE
Protein, ur: NEGATIVE mg/dL
Specific Gravity, Urine: >1.046 — ABNORMAL HIGH (ref 1.005–1.030)
pH: 6 (ref 5.0–8.0)

## 2021-12-08 LAB — HIV ANTIBODY (ROUTINE TESTING W REFLEX): HIV Screen 4th Generation wRfx: NONREACTIVE

## 2021-12-08 LAB — TROPONIN I (HIGH SENSITIVITY): Troponin I (High Sensitivity): 6 ng/L (ref <18)

## 2021-12-08 LAB — MAGNESIUM: Magnesium: 2 mg/dL (ref 1.7–2.4)

## 2021-12-08 MED ORDER — ROSUVASTATIN CALCIUM 10 MG PO TABS
10.0000 mg | ORAL_TABLET | Freq: Every day | ORAL | Status: DC
  Administered 2021-12-08 – 2021-12-09 (×2): 10 mg via ORAL
  Filled 2021-12-08 (×2): qty 1

## 2021-12-08 MED ORDER — FOLIC ACID 1 MG PO TABS
1.0000 mg | ORAL_TABLET | Freq: Every day | ORAL | Status: DC
  Administered 2021-12-08 – 2021-12-09 (×2): 1 mg via ORAL
  Filled 2021-12-08 (×2): qty 1

## 2021-12-08 MED ORDER — PANTOPRAZOLE SODIUM 40 MG PO TBEC
40.0000 mg | DELAYED_RELEASE_TABLET | Freq: Every day | ORAL | Status: DC
  Administered 2021-12-08 – 2021-12-09 (×2): 40 mg via ORAL
  Filled 2021-12-08 (×2): qty 1

## 2021-12-08 MED ORDER — ASPIRIN 81 MG PO TBEC
81.0000 mg | DELAYED_RELEASE_TABLET | Freq: Every day | ORAL | Status: DC
  Administered 2021-12-08 – 2021-12-09 (×2): 81 mg via ORAL
  Filled 2021-12-08 (×2): qty 1

## 2021-12-08 MED ORDER — ACETAMINOPHEN 650 MG RE SUPP: 650.0000 mg | Freq: Four times a day (QID) | RECTAL | Status: DC | PRN

## 2021-12-08 MED ORDER — KETOROLAC TROMETHAMINE 30 MG/ML IJ SOLN
30.0000 mg | Freq: Once | INTRAMUSCULAR | Status: AC
  Administered 2021-12-08: 30 mg via INTRAVENOUS
  Filled 2021-12-08: qty 1

## 2021-12-08 MED ORDER — ALBUTEROL SULFATE (2.5 MG/3ML) 0.083% IN NEBU: 2.5000 mg | INHALATION_SOLUTION | RESPIRATORY_TRACT | Status: DC | PRN

## 2021-12-08 MED ORDER — LORAZEPAM 2 MG/ML IJ SOLN: 1.0000 mg | INTRAMUSCULAR | Status: DC | PRN

## 2021-12-08 MED ORDER — ALBUTEROL SULFATE 108 (90 BASE) MCG/ACT IN AEPB: 2.0000 | INHALATION_SPRAY | RESPIRATORY_TRACT | Status: DC | PRN

## 2021-12-08 MED ORDER — IRBESARTAN 150 MG PO TABS
150.0000 mg | ORAL_TABLET | Freq: Every day | ORAL | Status: DC
  Administered 2021-12-08 – 2021-12-09 (×2): 150 mg via ORAL
  Filled 2021-12-08 (×2): qty 1

## 2021-12-08 MED ORDER — POTASSIUM CHLORIDE CRYS ER 20 MEQ PO TBCR
40.0000 meq | EXTENDED_RELEASE_TABLET | Freq: Once | ORAL | Status: AC
Start: 2021-12-08 — End: 2021-12-08
  Administered 2021-12-08: 40 meq via ORAL
  Filled 2021-12-08: qty 2

## 2021-12-08 MED ORDER — LORAZEPAM 1 MG PO TABS: 1.0000 mg | ORAL_TABLET | ORAL | Status: DC | PRN

## 2021-12-08 MED ORDER — ACETAMINOPHEN 325 MG PO TABS
650.0000 mg | ORAL_TABLET | Freq: Four times a day (QID) | ORAL | Status: DC | PRN
  Administered 2021-12-09: 650 mg via ORAL
  Filled 2021-12-08: qty 2

## 2021-12-08 MED ORDER — LACTATED RINGERS IV SOLN: INTRAVENOUS | Status: AC

## 2021-12-08 MED ORDER — ADULT MULTIVITAMIN W/MINERALS CH
1.0000 | ORAL_TABLET | Freq: Every day | ORAL | Status: DC
Start: 2021-12-08 — End: 2021-12-09
  Administered 2021-12-08 – 2021-12-09 (×2): 1 via ORAL
  Filled 2021-12-08 (×2): qty 1

## 2021-12-08 MED ORDER — THIAMINE HCL 100 MG PO TABS
100.0000 mg | ORAL_TABLET | Freq: Every day | ORAL | Status: DC
  Administered 2021-12-08 – 2021-12-09 (×2): 100 mg via ORAL
  Filled 2021-12-08 (×2): qty 1

## 2021-12-08 MED ORDER — THIAMINE HCL 100 MG/ML IJ SOLN
100.0000 mg | Freq: Every day | INTRAMUSCULAR | Status: DC
  Filled 2021-12-08 (×2): qty 2

## 2021-12-08 MED ORDER — KETOROLAC TROMETHAMINE 30 MG/ML IJ SOLN
30.0000 mg | Freq: Three times a day (TID) | INTRAMUSCULAR | Status: DC | PRN
Start: 2021-12-08 — End: 2021-12-09
  Administered 2021-12-08 – 2021-12-09 (×3): 30 mg via INTRAVENOUS
  Filled 2021-12-08 (×3): qty 1

## 2021-12-08 NOTE — H&P (Signed)
History and Physical    Zavien Clubb HUT:[id number] DOB: June 22, 1956 DOA: 12/07/2021  PCP: Alba Cory, MD  Patient coming from: Home.  Chief Complaint: Loss of consciousness.  HPI: Aaron Ross is a 66 y.o. male with history of hypertension, hyperlipidemia, COPD, sleep apnea was brought to the ER after patient had 3 episodes of loss of consciousness.  Patient states he was at home when he was trying to feed his dog and he lost consciousness at least 3 times back-to-back presented.  Of 5 minutes.  He did hit his head and also hurt his left chest.  These episodes probably happened when he was trying to walk.  He had no prodromal symptoms.  He does not recall the symptoms exactly.  Did not have any incontinence of urine.  Admits to drinking bourbon.  ED Course: In the ER EKG shows normal sinus rhythm with QTc of 462 ms.  Alcohol level was 156.  CT head and CT C-spine were unremarkable.  CT angiogram of the chest was showing left-sided rib fractures.  Admitted for further observation.  Review of Systems: As per HPI, rest all negative.   Past Medical History:  Diagnosis Date   COPD (chronic obstructive pulmonary disease) (HCC)    GERD (gastroesophageal reflux disease)    Hx of nonmelanoma skin cancer    Treated in [place] several >25 years ago   Hypertension    Sleep apnea     Past Surgical History:  Procedure Laterality Date   COSMETIC SURGERY     EYE SURGERY     HERNIA REPAIR       reports that he quit smoking about 4 years ago. His smoking use included cigarettes. He started smoking about 49 years ago. He has a 86.00 pack-year smoking history. He has never used smokeless tobacco. He reports current alcohol use of about 8.0 standard drinks of alcohol per week. He reports that he does not use drugs.  No Known Allergies  Family History  Problem Relation Age of Onset   Hypertension Father    Prostate cancer Neg Hx    Bladder Cancer Neg Hx    Kidney cancer Neg Hx     Prior to  Admission medications   Medication Sig Start Date End Date Taking? Authorizing Provider  Albuterol Sulfate (PROAIR RESPICLICK) 108 (90 Base) MCG/ACT AEPB Inhale 2 puffs into the lungs every 4 (four) hours as needed (shortness of breath). 05/16/21  Yes Caro Laroche, DO  aspirin 81 MG EC tablet TAKE 1 TABLET BY MOUTH EVERY DAY 05/26/18  Yes Sowles, Danna Hefty, MD  Cholecalciferol (VITAMIN D3) 50 MCG (2000 UT) CAPS Take 1 capsule (2,000 Units total) by mouth daily. 05/10/20  Yes Sowles, Danna Hefty, MD  pantoprazole (PROTONIX) 40 MG tablet TAKE 1 TABLET BY MOUTH EVERY DAY 05/22/21  Yes Sowles, Danna Hefty, MD  rosuvastatin (CRESTOR) 10 MG tablet Take 1 tablet (10 mg total) by mouth daily. 05/16/21  Yes Caro Laroche, DO  valsartan (DIOVAN) 160 MG tablet TAKE 1 TABLET BY MOUTH EVERY DAY 05/22/21  Yes Sowles, Danna Hefty, MD  losartan (COZAAR) 100 MG tablet Take 1 tablet (100 mg total) by mouth daily. 10/28/18 01/26/19  Alba Cory, MD    Physical Exam: Constitutional: Moderately built and nourished. Vitals:   12/08/21 0025 12/08/21 0100 12/08/21 0140 12/08/21 0205  BP:  103/73 123/88 102/72  Pulse: 70 60 62 65  Resp: 15 16 14 14   Temp:    98.1 F (36.7 C)  TempSrc:    Oral  SpO2: (!) 82% 100% 96% 97%  Weight:      Height:       Eyes: Anicteric no pallor. ENMT: No discharge from the ears eyes nose and mouth. Neck: No mass felt.  No neck rigidity. Respiratory: No rhonchi or crepitations. Cardiovascular: S1-S2 heard. Abdomen: Soft nontender bowel sound present. Musculoskeletal: No edema.  Pain on palpation of the left side of the chest wall. Skin: No rash. Neurologic: Alert awake oriented to time place and person.  Moves all extremities. Psychiatric: Appears normal.  Normal affect.   Labs on Admission: I have personally reviewed following labs and imaging studies  CBC: Recent Labs  Lab 12/07/21 2141  WBC 6.8  HGB 14.8  HCT 45.6  MCV 95.4  PLT 228   Basic Metabolic Panel: Recent  Labs  Lab 12/07/21 2141 12/07/21 2330  NA 142  --   K 3.2*  --   CL 107  --   CO2 26  --   GLUCOSE 126*  --   BUN 16  --   CREATININE 1.16  --   CALCIUM 9.2  --   MG  --  2.0   GFR: Estimated Creatinine Clearance: 61.9 mL/min (by C-G formula based on SCr of 1.16 mg/dL). Liver Function Tests: Recent Labs  Lab 12/07/21 2141  AST 43*  ALT 38  ALKPHOS 70  BILITOT 0.9  PROT 7.6  ALBUMIN 4.3   No results for input(s): "LIPASE", "AMYLASE" in the last 168 hours. No results for input(s): "AMMONIA" in the last 168 hours. Coagulation Profile: No results for input(s): "INR", "PROTIME" in the last 168 hours. Cardiac Enzymes: No results for input(s): "CKTOTAL", "CKMB", "CKMBINDEX", "TROPONINI" in the last 168 hours. BNP (last 3 results) No results for input(s): "PROBNP" in the last 8760 hours. HbA1C: No results for input(s): "HGBA1C" in the last 72 hours. CBG: No results for input(s): "GLUCAP" in the last 168 hours. Lipid Profile: No results for input(s): "CHOL", "HDL", "LDLCALC", "TRIG", "CHOLHDL", "LDLDIRECT" in the last 72 hours. Thyroid Function Tests: No results for input(s): "TSH", "T4TOTAL", "FREET4", "T3FREE", "THYROIDAB" in the last 72 hours. Anemia Panel: No results for input(s): "VITAMINB12", "FOLATE", "FERRITIN", "TIBC", "IRON", "RETICCTPCT" in the last 72 hours. Urine analysis:    Component Value Date/Time   APPEARANCEUR Clear 04/09/2018 0943   GLUCOSEU Negative 04/09/2018 0943   BILIRUBINUR Negative 04/11/2018 1336   BILIRUBINUR Negative 04/09/2018 0943   PROTEINUR Negative 04/11/2018 1336   PROTEINUR Negative 04/09/2018 0943   UROBILINOGEN 0.2 04/11/2018 1336   NITRITE Negative 04/11/2018 1336   NITRITE Negative 04/09/2018 0943   LEUKOCYTESUR Negative 04/11/2018 1336   LEUKOCYTESUR Trace (A) 04/09/2018 0943   Sepsis Labs: @LABRCNTIP (procalcitonin:4,lacticidven:4) )No results found for this or any previous visit (from the past 240 hour(s)).    Radiological Exams on Admission: CT Angio Chest PE W and/or Wo Contrast  Result Date: 12/08/2021 CLINICAL DATA:  Pulmonary embolism (PE) suspected, unknown D-dimer. Syncope. Left side rib pain. EXAM: CT ANGIOGRAPHY CHEST WITH CONTRAST TECHNIQUE: Multidetector CT imaging of the chest was performed using the standard protocol during bolus administration of intravenous contrast. Multiplanar CT image reconstructions and MIPs were obtained to evaluate the vascular anatomy. RADIATION DOSE REDUCTION: This exam was performed according to the departmental dose-optimization program which includes automated exposure control, adjustment of the mA and/or kV according to patient size and/or use of iterative reconstruction technique. CONTRAST:  4mL OMNIPAQUE IOHEXOL 350 MG/ML SOLN COMPARISON:  06/09/2021 FINDINGS: Cardiovascular: No filling defects in the pulmonary arteries to  suggest pulmonary emboli. Heart is normal size. Scattered coronary artery and aortic calcifications. No aneurysm. Mediastinum/Nodes: No mediastinal, hilar, or axillary adenopathy. Trachea and esophagus are unremarkable. Thyroid unremarkable. Lungs/Pleura: Severe bullous emphysema. No confluent opacities or effusions. Upper Abdomen: No acute findings Musculoskeletal: Fracture through the lateral left 4th and 5th ribs. Chest wall soft tissues are unremarkable. Review of the MIP images confirms the above findings. IMPRESSION: No evidence of pulmonary embolus. Coronary artery disease. Left lateral 4th and 5th rib fractures. Aortic Atherosclerosis (ICD10-I70.0) and Emphysema (ICD10-J43.9). Electronically Signed   By: Charlett Nose M.D.   On: 12/08/2021 00:15   CT Cervical Spine Wo Contrast  Result Date: 12/08/2021 CLINICAL DATA:  Neck trauma, intoxicated or obtunded (Age >= 16y). Syncope. EXAM: CT CERVICAL SPINE WITHOUT CONTRAST TECHNIQUE: Multidetector CT imaging of the cervical spine was performed without intravenous contrast. Multiplanar CT image  reconstructions were also generated. RADIATION DOSE REDUCTION: This exam was performed according to the departmental dose-optimization program which includes automated exposure control, adjustment of the mA and/or kV according to patient size and/or use of iterative reconstruction technique. COMPARISON:  None Available. FINDINGS: Alignment: Normal Skull base and vertebrae: No acute fracture. No primary bone lesion or focal pathologic process. Soft tissues and spinal canal: No prevertebral fluid or swelling. No visible canal hematoma. Disc levels:  Disc spaces maintained. Upper chest: Emphysema. Other: None IMPRESSION: No acute bony abnormality. Electronically Signed   By: Charlett Nose M.D.   On: 12/08/2021 00:12   DG Chest 2 View  Result Date: 12/07/2021 CLINICAL DATA:  Left ribcage pain.  Fell at home. EXAM: CHEST - 2 VIEW COMPARISON:  Chest CT 06/09/2021. FINDINGS: The cardiac size is normal. The aorta is tortuous with scattered calcifications, stable mediastinum. There is advanced emphysema with chronic bullous disease and scarring in the upper lobes. No infiltrate is seen. There is no pleural effusion or appreciable pneumothorax. There is osteopenia. There is new demonstration of a slightly displaced fracture of the posterior left fifth rib which is likely acute and does not show callus formation. No further displaced rib fractures are visible on this nondedicated exam. There is thoracic kyphosis without appreciable spinal compression injury. IMPRESSION: 1. Slightly displaced transverse fracture of the posterior left fifth rib new from the prior CT, likely acute. 2. No visible pneumothorax.  Otherwise stable COPD chest. 3. Aortic atherosclerosis and uncoiling. Electronically Signed   By: Almira Bar M.D.   On: 12/07/2021 22:20   CT HEAD WO CONTRAST ( )  Result Date: 12/07/2021 CLINICAL DATA:  Minor head trauma.  66 year old male. EXAM: CT HEAD WITHOUT CONTRAST TECHNIQUE: Contiguous axial images were  obtained from the base of the skull through the vertex without intravenous contrast. RADIATION DOSE REDUCTION: This exam was performed according to the departmental dose-optimization program which includes automated exposure control, adjustment of the mA and/or kV according to patient size and/or use of iterative reconstruction technique. COMPARISON:  Head CT 01/10/2021. FINDINGS: Brain: No evidence of acute infarction, hemorrhage, hydrocephalus, extra-axial collection or mass lesion/mass effect. Vascular: No hyperdense vessel or unexpected calcification. Skull: No fracture or focal lesion is seen. The calvarium, skull base and orbits are intact. Sinuses/Orbits: There is chronic opacification in the anterior right ethmoid sinus, mild thickening in the right frontal sinus. Other visible sinuses and the bilateral mastoid air cells are clear. The nasal septum is S shaped. Unremarkable orbital contents. Other: None. IMPRESSION: No acute intracranial CT findings, depressed skull fractures or interval changes. Electronically Signed   By: Mellody Dance  Chesser M.D.   On: 12/07/2021 22:15    EKG: Independently reviewed.  Normal sinus rhythm QTc of 462 ms.  Assessment/Plan Principal Problem:   Syncope Active Problems:   COPD (chronic obstructive pulmonary disease) (HCC)   Rib fracture    Syncope -cause not clear and patient is not exactly able to recall the incident.  But states he had 3 episodes of syncope back-to-back and within a period of 5 minutes with no incontinence of urine.  Had a similar episode during July 2022 when he had COVID infection.  Will check 2D echo continue to monitor telemetry.  Check orthostatics. Left-sided rib fracture from fall.  Incentive spirometer.  Closely monitor. Hypertension valsartan.  Check orthostatics. Hyperlipidemia on statins. Alcohol use on CIWA protocol.   DVT prophylaxis: SCDs.  We will start Lovenox if there is no further worsening of his left-sided rib fracture. Code  Status: Full code. Family Communication: Discussed with patient. Disposition Plan: Home. Consults called: None.   Admission status: Observation.   Eduard Clos MD [facility] Pager [phone].  If 7PM-7AM, please contact night-coverage [email or web address] Password [email or web address]  12/08/2021, 2:21 AM

## 2021-12-08 NOTE — Progress Notes (Signed)
   Follow Up Note  HPI: 66 year old male past with history of COPD, sleep apnea, hypertension and episodic alcohol use presented to the emergency room after 3 back-to-back episodes of syncope.  Patient had been drinking and was at home when he tried to feed his dog and suddenly lost consciousness and fell hitting his head and left chest.  In the emergency room, patient noted to have an alcohol level of 156 and CT angiogram of chest noted left-sided rib fractures.  No evidence of pulmonary embolus.  He was also noted to be mildly hypotensive initially with a systolic blood pressure in the 90s. Pt admitted earlier this morning.  Seen after arrived to floor.   Patient noted to be mildly hypoxic requiring 2 to 3 L nasal cannula.  Initially oxygen saturations improved, however when he stood up at bedside, oxygen saturations on room air dropped to 84 and he felt lightheaded.  Echocardiogram done with results pending. Exam: CV: Regular rate and rhythm, S1-S2 Lungs: Clear to auscultation bilaterally   Principal Problem:   Syncope Active Problems:   COPD (chronic obstructive pulmonary disease) (HCC)   Rib fracture   Acute respiratory failure with hypoxia (HCC)

## 2021-12-08 NOTE — ED Provider Notes (Signed)
[facility] Provider Note    Event Date/Time   First MD Initiated Contact with Patient 12/07/21 2302     (approximate)   History   Loss of Consciousness   HPI  Aaron Ross is a 66 y.o. male with history of hypertension, hyperlipidemia, COPD, sleep apnea who presents to the emergency department with his wife after he had 3 syncopal events at home tonight.  States he was in his normal state of health when he passed out.  He is not aware of any preceding symptoms.  He states he did have a syncopal episode about 1 to 2 years ago without any known cause.  On review of his records it looks like he was here in the ED in July 2022 for syncopal event but had COVID-19 at that time and that was thought to be the reason for his syncopal event.  States he is not having left-sided chest pain but cannot recall if this happened before or after the syncopal events.  Wife witnessed 2 of these episodes and denies any seizure-like activity.  No tongue biting or incontinence.  He is feeling short of breath.  No recent nausea, vomiting, diarrhea, bloody stools, melena.  He denies headache, neck or back pain.  No numbness, tingling or weakness.  Takes aspirin but no other antiplatelet or anticoagulant.  He denies any history of cardiac disease, arrhythmia, PE or DVT but states there is a strong family history of coronary artery disease.  States he did have 2 bourbon on the rocks tonight.   History provided by patient and wife.    Past Medical History:  Diagnosis Date   COPD (chronic obstructive pulmonary disease) (HCC)    GERD (gastroesophageal reflux disease)    Hx of nonmelanoma skin cancer    Treated in [place] several >25 years ago   Hypertension    Sleep apnea     Past Surgical History:  Procedure Laterality Date   COSMETIC SURGERY     EYE SURGERY     HERNIA REPAIR      MEDICATIONS:  Prior to Admission medications   Medication Sig Start Date End Date Taking? Authorizing  Provider  Albuterol Sulfate (PROAIR RESPICLICK) 108 (90 Base) MCG/ACT AEPB Inhale 2 puffs into the lungs every 4 (four) hours as needed (shortness of breath). 05/16/21  Yes Caro Laroche, DO  aspirin 81 MG EC tablet TAKE 1 TABLET BY MOUTH EVERY DAY 05/26/18  Yes Sowles, Danna Hefty, MD  Cholecalciferol (VITAMIN D3) 50 MCG (2000 UT) CAPS Take 1 capsule (2,000 Units total) by mouth daily. 05/10/20  Yes Sowles, Danna Hefty, MD  pantoprazole (PROTONIX) 40 MG tablet TAKE 1 TABLET BY MOUTH EVERY DAY 05/22/21  Yes Sowles, Danna Hefty, MD  rosuvastatin (CRESTOR) 10 MG tablet Take 1 tablet (10 mg total) by mouth daily. 05/16/21  Yes Caro Laroche, DO  valsartan (DIOVAN) 160 MG tablet TAKE 1 TABLET BY MOUTH EVERY DAY 05/22/21  Yes Sowles, Danna Hefty, MD  losartan (COZAAR) 100 MG tablet Take 1 tablet (100 mg total) by mouth daily. 10/28/18 01/26/19  Alba Cory, MD    Physical Exam   Triage Vital Signs: ED Triage Vitals [12/07/21 2133]  Enc Vitals Group     BP 96/78     Pulse Rate 73     Resp (!) 22     Temp 98.7 F (37.1 C)     Temp Source Oral     SpO2 93 %     Weight 150 lb (68  kg)     Height 6\' 1"  (1.854 m)     Head Circumference      Peak Flow      Pain Score 6     Pain Loc      Pain Edu?      Excl. in GC?     Most recent vital signs: Vitals:   12/07/21 2330 12/08/21 0025  BP: 114/71   Pulse: 66 70  Resp: 19 15  Temp:    SpO2: 91% (!) 82%     CONSTITUTIONAL: Alert and oriented and responds appropriately to questions.  Appears extremely uncomfortable, moaning in pain HEAD: Normocephalic; atraumatic EYES: Conjunctivae clear, PERRL, EOMI ENT: normal nose; no rhinorrhea; moist mucous membranes; pharynx without lesions noted; no dental injury; no septal hematoma, no epistaxis; no facial deformity or bony tenderness NECK: Supple, no midline spinal tenderness, step-off or deformity; trachea midline CARD: RRR; S1 and S2 appreciated; no murmurs, no clicks, no rubs, no gallops RESP: Patient  is splinting.  Breath sounds clear and equal bilaterally without rhonchi, wheezing or rales.  No hypoxia currently. CHEST: Patient is very tender to palpation over the left chest wall and has an abrasion to the left chest wall.  No flail chest.  No ecchymosis. ABD/GI: Normal bowel sounds; non-distended; soft, non-tender, no rebound, no guarding; no ecchymosis or other lesions noted PELVIS:  stable, nontender to palpation BACK:  The back appears normal; no midline spinal tenderness, step-off or deformity EXT: Normal ROM in all joints; non-tender to palpation; no edema; normal capillary refill; no cyanosis, no bony tenderness or bony deformity of patient's extremities, no joint effusion, compartments are soft, extremities are warm and well-perfused, no ecchymosis, no calf tenderness or calf swelling SKIN: Normal color for age and race; warm NEURO: No facial asymmetry, normal speech, moving all extremities equally  ED Results / Procedures / Treatments   LABS: (all labs ordered are listed, but only abnormal results are displayed) Labs Reviewed  COMPREHENSIVE METABOLIC PANEL - Abnormal; Notable for the following components:      Result Value   Potassium 3.2 (*)    Glucose, Bld 126 (*)    AST 43 (*)    All other components within normal limits  ETHANOL - Abnormal; Notable for the following components:   Alcohol, Ethyl (B) 156 (*)    All other components within normal limits  CBC  URINALYSIS, ROUTINE W REFLEX MICROSCOPIC  MAGNESIUM  TROPONIN I (HIGH SENSITIVITY)  TROPONIN I (HIGH SENSITIVITY)     EKG:  EKG Interpretation  Date/Time:  Thursday December 07 2021 21:27:35 EDT Ventricular Rate:  71 PR Interval:  174 QRS Duration: 88 QT Interval:  404 QTC Calculation: 439 R Axis:   -18 Text Interpretation: Normal sinus rhythm Possible Inferior infarct , age undetermined Anterior infarct , age undetermined Abnormal ECG When compared with ECG of 30-Jan-2021 08:11, Anterior infarct is now  Present Borderline criteria for Inferior infarct are now Present Confirmed by 01-Feb-[id number]) on 12/08/2021 12:36:45 AM          RADIOLOGY: My personal review and interpretation of imaging: Chest x-ray and CT chest show 2 left-sided rib fractures.  No PE.  CT head and cervical spine show no traumatic injury.  I have personally reviewed all radiology reports. CT Angio Chest PE W and/or Wo Contrast  Result Date: 12/08/2021 CLINICAL DATA:  Pulmonary embolism (PE) suspected, unknown D-dimer. Syncope. Left side rib pain. EXAM: CT ANGIOGRAPHY CHEST WITH CONTRAST TECHNIQUE: Multidetector CT imaging of  the chest was performed using the standard protocol during bolus administration of intravenous contrast. Multiplanar CT image reconstructions and MIPs were obtained to evaluate the vascular anatomy. RADIATION DOSE REDUCTION: This exam was performed according to the departmental dose-optimization program which includes automated exposure control, adjustment of the mA and/or kV according to patient size and/or use of iterative reconstruction technique. CONTRAST:  40mL OMNIPAQUE IOHEXOL 350 MG/ML SOLN COMPARISON:  06/09/2021 FINDINGS: Cardiovascular: No filling defects in the pulmonary arteries to suggest pulmonary emboli. Heart is normal size. Scattered coronary artery and aortic calcifications. No aneurysm. Mediastinum/Nodes: No mediastinal, hilar, or axillary adenopathy. Trachea and esophagus are unremarkable. Thyroid unremarkable. Lungs/Pleura: Severe bullous emphysema. No confluent opacities or effusions. Upper Abdomen: No acute findings Musculoskeletal: Fracture through the lateral left 4th and 5th ribs. Chest wall soft tissues are unremarkable. Review of the MIP images confirms the above findings. IMPRESSION: No evidence of pulmonary embolus. Coronary artery disease. Left lateral 4th and 5th rib fractures. Aortic Atherosclerosis (ICD10-I70.0) and Emphysema (ICD10-J43.9). Electronically Signed   By:  Charlett Nose M.D.   On: 12/08/2021 00:15   CT Cervical Spine Wo Contrast  Result Date: 12/08/2021 CLINICAL DATA:  Neck trauma, intoxicated or obtunded (Age >= 16y). Syncope. EXAM: CT CERVICAL SPINE WITHOUT CONTRAST TECHNIQUE: Multidetector CT imaging of the cervical spine was performed without intravenous contrast. Multiplanar CT image reconstructions were also generated. RADIATION DOSE REDUCTION: This exam was performed according to the departmental dose-optimization program which includes automated exposure control, adjustment of the mA and/or kV according to patient size and/or use of iterative reconstruction technique. COMPARISON:  None Available. FINDINGS: Alignment: Normal Skull base and vertebrae: No acute fracture. No primary bone lesion or focal pathologic process. Soft tissues and spinal canal: No prevertebral fluid or swelling. No visible canal hematoma. Disc levels:  Disc spaces maintained. Upper chest: Emphysema. Other: None IMPRESSION: No acute bony abnormality. Electronically Signed   By: Charlett Nose M.D.   On: 12/08/2021 00:12   DG Chest 2 View  Result Date: 12/07/2021 CLINICAL DATA:  Left ribcage pain.  Fell at home. EXAM: CHEST - 2 VIEW COMPARISON:  Chest CT 06/09/2021. FINDINGS: The cardiac size is normal. The aorta is tortuous with scattered calcifications, stable mediastinum. There is advanced emphysema with chronic bullous disease and scarring in the upper lobes. No infiltrate is seen. There is no pleural effusion or appreciable pneumothorax. There is osteopenia. There is new demonstration of a slightly displaced fracture of the posterior left fifth rib which is likely acute and does not show callus formation. No further displaced rib fractures are visible on this nondedicated exam. There is thoracic kyphosis without appreciable spinal compression injury. IMPRESSION: 1. Slightly displaced transverse fracture of the posterior left fifth rib new from the prior CT, likely acute. 2. No  visible pneumothorax.  Otherwise stable COPD chest. 3. Aortic atherosclerosis and uncoiling. Electronically Signed   By: Almira Bar M.D.   On: 12/07/2021 22:20   CT HEAD WO CONTRAST ( )  Result Date: 12/07/2021 CLINICAL DATA:  Minor head trauma.  66 year old male. EXAM: CT HEAD WITHOUT CONTRAST TECHNIQUE: Contiguous axial images were obtained from the base of the skull through the vertex without intravenous contrast. RADIATION DOSE REDUCTION: This exam was performed according to the departmental dose-optimization program which includes automated exposure control, adjustment of the mA and/or kV according to patient size and/or use of iterative reconstruction technique. COMPARISON:  Head CT 01/10/2021. FINDINGS: Brain: No evidence of acute infarction, hemorrhage, hydrocephalus, extra-axial collection or mass lesion/mass effect.  Vascular: No hyperdense vessel or unexpected calcification. Skull: No fracture or focal lesion is seen. The calvarium, skull base and orbits are intact. Sinuses/Orbits: There is chronic opacification in the anterior right ethmoid sinus, mild thickening in the right frontal sinus. Other visible sinuses and the bilateral mastoid air cells are clear. The nasal septum is S shaped. Unremarkable orbital contents. Other: None. IMPRESSION: No acute intracranial CT findings, depressed skull fractures or interval changes. Electronically Signed   By: Almira Bar M.D.   On: 12/07/2021 22:15     PROCEDURES:  Critical Care performed: Yes, see critical care procedure note(s)   CRITICAL CARE Performed by: Baxter Hire Azalee Weimer   Total critical care time: 45 minutes  Critical care time was exclusive of separately billable procedures and treating other patients.  Critical care was necessary to treat or prevent imminent or life-threatening deterioration.  Critical care was time spent personally by me on the following activities: development of treatment plan with patient and/or surrogate as  well as nursing, discussions with consultants, evaluation of patient's response to treatment, examination of patient, obtaining history from patient or surrogate, ordering and performing treatments and interventions, ordering and review of laboratory studies, ordering and review of radiographic studies, pulse oximetry and re-evaluation of patient's condition.   Marland Kitchen1-3 Lead EKG Interpretation  Performed by: Marbeth Smedley, Layla Maw, DO Authorized by: Sheena Donegan, Layla Maw, DO     Interpretation: normal     ECG rate:  70   ECG rate assessment: normal     Rhythm: sinus rhythm     Ectopy: none     Conduction: normal       IMPRESSION / MDM / ASSESSMENT AND PLAN / ED COURSE  I reviewed the triage vital signs and the nursing notes.  Patient here with 3 syncopal events at home and now having left-sided chest pain.  The patient is on the cardiac monitor to evaluate for evidence of arrhythmia and/or significant heart rate changes.   DIFFERENTIAL DIAGNOSIS (includes but not limited to):   ACS, PE, dissection arrhythmia, electrolyte derangement, anemia, orthostasis, CHF, rib fractures, chest wall contusion  Patient's presentation is most consistent with acute presentation with potential threat to life or bodily function.  PLAN: We will obtain CBC, BMP, troponin x2, chest x-ray.  EKG shows no new ischemic change or arrhythmia.  Will monitor on cardiac monitoring.  Will give pain medication.   MEDICATIONS GIVEN IN ED: Medications  sodium chloride 0.9 % bolus 1,000 mL (1,000 mLs Intravenous New Bag/Given 12/08/21 0021)  ketorolac (TORADOL) 30 MG/ML injection 30 mg (has no administration in time range)  potassium chloride SA (KLOR-CON M) CR tablet 40 mEq (has no administration in time range)  morphine (PF) 4 MG/ML injection 4 mg (4 mg Intravenous Given 12/08/21 0018)  ondansetron (ZOFRAN) injection 4 mg (4 mg Intravenous Given 12/08/21 0018)  iohexol (OMNIPAQUE) 350 MG/ML injection 50 mL (50 mLs Intravenous  Contrast Given 12/07/21 2359)     ED COURSE: Patient's labs show normal hemoglobin.  Slightly low potassium level of 3.2.  Will give oral replacement and add on magnesium level.  Normal glucose.  Normal renal function.  Troponin x1 negative.  Alcohol level is 156.  Chest x-ray reviewed/interpreted by myself and radiologist and shows possible left rib fracture but no pneumothorax.  We will proceed with CT imaging for further evaluation.    Patient CT head, cervical spine, chest reviewed/interpreted by myself and radiologist and shows 2 acute left rib fractures at the fourth and fifth ribs  likely the cause of his pain.  I suspect this occurred with his first syncopal event.  Wife reports that she witnessed the next 2 events and was able to catch him and lowered him to the ground.  He is hypoxic here likely secondary to his rib fractures as he is splinting and appears uncomfortable.  Placed on 2 L nasal cannula and is doing well.  Does not wear oxygen chronically.  His lungs are clear to auscultation.  CT scan shows no PE.  CT of the head and cervical spine show no acute traumatic injury.  Will discuss with hospitalist for admission for work-up for syncope as well as continued pain control, pulmonary toilet for new rib fractures and acute hypoxia.   No events noted on cardiac monitoring here.  CONSULTS:  Consulted and discussed patient's case with hospitalist, Dr. Toniann FailKakrakandy.  I have recommended admission and consulting physician agrees and will place admission orders.  Patient (and family if present) agree with this plan.   I reviewed all nursing notes, vitals, pertinent previous records.  All labs, EKGs, imaging ordered have been independently reviewed and interpreted by myself.    OUTSIDE RECORDS REVIEWED: Reviewed patient's previous admission in April 2018 for pneumothorax.  I do not see any previous record of an echocardiogram or stress test.       FINAL CLINICAL IMPRESSION(S) / ED  DIAGNOSES   Final diagnoses:  Syncope and collapse  Closed fracture of multiple ribs of left side, initial encounter  Acute respiratory failure with hypoxia (HCC)     Rx / DC Orders   ED Discharge Orders     None        Note:  This document was prepared using Dragon voice recognition software and may include unintentional dictation errors.   Veer Elamin, Layla MawKristen N, DO 12/08/21 [phone]

## 2021-12-08 NOTE — Plan of Care (Signed)

## 2021-12-08 NOTE — Progress Notes (Signed)
*  PRELIMINARY RESULTS* Echocardiogram 2D Echocardiogram has been performed.  Aaron Ross 12/08/2021, 2:19 PM

## 2021-12-08 NOTE — TOC Initial Note (Signed)
Transition of Care [facility]) - Initial/Assessment Note    Patient Details  Name: Aaron Ross MRN: [id number] Date of Birth: 19-Feb-1956  [facility] CM/SW Contact:    Liliana Cline, LCSW Phone Number: 12/08/2021, 11:11 AM  Clinical Narrative:            TOC consulted for SA resources. CSW spoke with patient who declines SA resources at this time. Patient states he goes to Dr. Carlynn Purl for Primary Care. Pharmacy is [facility]. Patient says he drives himself to appointments.  Patient denies TOC needs.         Expected Discharge Plan: Home/Self Care     Patient Goals and CMS Choice Patient states their goals for this hospitalization and ongoing recovery are:: to return home CMS Medicare.gov Compare Post Acute Care list provided to:: Patient Choice offered to / list presented to : Patient  Expected Discharge Plan and Services Expected Discharge Plan: Home/Self Care                                              Prior Living Arrangements/Services     Patient language and need for interpreter reviewed:: Yes Do you feel safe going back to the place where you live?: Yes            Criminal Activity/Legal Involvement Pertinent to Current Situation/Hospitalization: No - Comment as needed  Activities of Daily Living Home Assistive Devices/Equipment: Eyeglasses ADL Screening (condition at time of admission) Patient's cognitive ability adequate to safely complete daily activities?: Yes Is the patient deaf or have difficulty hearing?: No Does the patient have difficulty seeing, even when wearing glasses/contacts?: No Does the patient have difficulty concentrating, remembering, or making decisions?: No Patient able to express need for assistance with ADLs?: Yes Does the patient have difficulty dressing or bathing?: No Independently performs ADLs?: Yes (appropriate for developmental age) Does the patient have difficulty walking or climbing stairs?:  No Weakness of Legs: None Weakness of Arms/Hands: None  Permission Sought/Granted                  Emotional Assessment       Orientation: : Oriented to Self, Oriented to Place, Oriented to  Time, Oriented to Situation Alcohol / Substance Use: Alcohol Use Psych Involvement: No (comment)  Admission diagnosis:  Syncope and collapse [R55] Syncope [R55] Acute respiratory failure with hypoxia (HCC) [J96.01] Closed fracture of multiple ribs of left side, initial encounter [S22.42XA] Patient Active Problem List   Diagnosis Date Noted   Syncope 12/08/2021   Rib fracture 12/08/2021   Senile purpura (HCC) 11/03/2019   Lung nodules 11/03/2019   Atherosclerosis of aorta (HCC) 10/23/2017   History of pneumothorax 10/11/2016   Hyperlipidemia 11/29/2015   GERD (gastroesophageal reflux disease) 11/29/2015   HTN (hypertension) 01/11/2015   COPD (chronic obstructive pulmonary disease) (HCC) 01/11/2015   Erectile dysfunction 01/11/2015   Hyperglycemia 01/11/2015   PCP:  Alba Cory, MD [facility]   [phone] [place] [place] [facility] [address] [place] [address] Phone: [phone] Fax: [phone]  [facility] [id number] [place], [place] - [address] [place] [address] Phone: [phone] Fax: [phone]     Social Determinants of Health (SDOH) Interventions    Readmission Risk Interventions     No data to display

## 2021-12-08 NOTE — ED Notes (Signed)
Pt desatted to low 80's after morphine. Placed on 2 L [place] , o2 stat up to 99% at this time. Ward MD aware

## 2021-12-09 DIAGNOSIS — F10229 Alcohol dependence with intoxication, unspecified: Secondary | ICD-10-CM | POA: Diagnosis not present

## 2021-12-09 DIAGNOSIS — I5032 Chronic diastolic (congestive) heart failure: Secondary | ICD-10-CM | POA: Diagnosis not present

## 2021-12-09 DIAGNOSIS — J9601 Acute respiratory failure with hypoxia: Secondary | ICD-10-CM | POA: Diagnosis not present

## 2021-12-09 DIAGNOSIS — R55 Syncope and collapse: Secondary | ICD-10-CM | POA: Diagnosis not present

## 2021-12-09 LAB — BRAIN NATRIURETIC PEPTIDE: B Natriuretic Peptide: 165.7 pg/mL — ABNORMAL HIGH (ref 0.0–100.0)

## 2021-12-09 MED ORDER — VALSARTAN 80 MG PO TABS: 80.0000 mg | ORAL_TABLET | Freq: Every day | ORAL | 11 refills | Status: DC

## 2021-12-09 MED ORDER — ALBUTEROL SULFATE HFA 108 (90 BASE) MCG/ACT IN AERS: 2.0000 | INHALATION_SPRAY | Freq: Four times a day (QID) | RESPIRATORY_TRACT | 2 refills | Status: AC | PRN

## 2021-12-09 NOTE — Plan of Care (Signed)

## 2021-12-09 NOTE — Progress Notes (Signed)
SATURATION QUALIFICATIONS: (This note is used to comply with regulatory documentation for home oxygen)  Patient Saturations on Room Air at Rest = 96%  Patient Saturations on Room Air while Ambulating = 81%  Patient took slow deep breaths and returned to 93%.  Please briefly explain why patient needs home oxygen: Patient not able to maintain O2 ambulating and was admitted for syncopal episodes.

## 2021-12-09 NOTE — Plan of Care (Signed)

## 2021-12-09 NOTE — Discharge Instructions (Signed)
Start using CPAP again.

## 2021-12-09 NOTE — Plan of Care (Signed)
Notified MD Rito Ehrlich by secured chat about patient's walk test on around unit once with no O2 and his O2 dropped to 81% RA. Walk was paused and patient took some slow deep breaths and quickly return to 93% RA and walk was completed.

## 2021-12-10 DIAGNOSIS — I5032 Chronic diastolic (congestive) heart failure: Secondary | ICD-10-CM | POA: Diagnosis present

## 2021-12-10 DIAGNOSIS — F10229 Alcohol dependence with intoxication, unspecified: Secondary | ICD-10-CM | POA: Diagnosis present

## 2021-12-10 DIAGNOSIS — F1020 Alcohol dependence, uncomplicated: Secondary | ICD-10-CM | POA: Diagnosis present

## 2021-12-10 NOTE — Assessment & Plan Note (Signed)
Suspect secondary to COPD versus rib fractures.  With fall, patient had several rib fractures.  Poor inspiratory effort.  No evidence of chronic hypoxia as when he would stand after a few minutes, oxygenation would normalize.  Does not qualify for oxygen.  CT scan of chest ruled out PE, but did note emphysematous findings.  Patient is already quit smoking a number of years ago.  Advised him to change his as needed albuterol to scheduled plus as needed and have given referral to pulmonary for pulmonary function test and further medication management.

## 2021-12-10 NOTE — Assessment & Plan Note (Signed)
Secondary to fall.  By time of discharge much improved.  Incentive spirometer encouraged.

## 2021-12-10 NOTE — Assessment & Plan Note (Signed)
Secondary orthostatic hypotension.  I suspect that patient likely has lower blood pressure at times and then when he drinks heavily, diuresis and becomes even more dehydrated and this causes passing out.  When he presented to the emergency room, blood pressures were soft in the 90s.  After he received IV fluids, much improved.  Advised him that he really needs to decrease his drinking.  In the interim, have decreased his Diovan.

## 2021-12-10 NOTE — Assessment & Plan Note (Signed)
As above.  Greatly advised to cut back if not stop his drinking.

## 2021-12-10 NOTE — Assessment & Plan Note (Signed)
As above.

## 2021-12-10 NOTE — Discharge Summary (Signed)
Physician Discharge Summary   Patient: Aaron Ross MRN: [id number] DOB: 1955/11/01  Admit date:     12/07/2021  Discharge date: 12/09/2021  Discharge Physician: Aaron Ross   PCP: Aaron Cory, MD   Recommendations at discharge:   Patient advised to resume his CPAP.  He was previously diagnosed with sleep apnea, but has not been using it since he and his wife now sleep in separate beds and he was using it for his snoring. Medication change: Valsartan decreased from 160 mg to 80 mg Patient has been advised to greatly cut back on his drinking. Patient given referral for pulmonary for pulmonary function tests. Patient will keep scheduled appointment with his PCP this coming week.  Discharge Diagnoses: Principal Problem:   Syncope Active Problems:   Acute respiratory failure with hypoxia (HCC)   COPD (chronic obstructive pulmonary disease) (HCC)   Rib fracture   Chronic diastolic CHF (congestive heart failure) (HCC)   Acute alcoholic intoxication in alcoholism with complication, episodic drinking behavior (HCC)  Resolved Problems:   * No resolved hospital problems. *  Hospital Course: 66 year old male with past medical history of COPD, sleep apnea, hypertension and episodic alcohol use presented to the emergency room after multiple episodes in a row of syncope.  Patient had been drinking at home when he suddenly lost consciousness and fell hitting his head and left side of chest.  In the emergency room, noted to have an alcohol level of 156 and CT angiogram noted no evidence of pulmonary embolus, but did note left-sided rib fractures.  Noted to be mildly hypotensive and brought in for further evaluation.  He was also noted to be mildly hypoxic at first requiring 2 to 3 L nasal cannula.  Assessment and Plan: * Syncope Secondary orthostatic hypotension.  I suspect that patient likely has lower blood pressure at times and then when he drinks heavily, diuresis and becomes even more  dehydrated and this causes passing out.  When he presented to the emergency room, blood pressures were soft in the 90s.  After he received IV fluids, much improved.  Advised him that he really needs to decrease his drinking.  In the interim, have decreased his Diovan.  Acute respiratory failure with hypoxia (HCC) Suspect secondary to COPD versus rib fractures.  With fall, patient had several rib fractures.  Poor inspiratory effort.  No evidence of chronic hypoxia as when he would stand after a few minutes, oxygenation would normalize.  Does not qualify for oxygen.  CT scan of chest ruled out PE, but did note emphysematous findings.  Patient is already quit smoking a number of years ago.  Advised him to change his as needed albuterol to scheduled plus as needed and have given referral to pulmonary for pulmonary function test and further medication management.  COPD (chronic obstructive pulmonary disease) (HCC) As above  Rib fracture Secondary to fall.  By time of discharge much improved.  Incentive spirometer encouraged.  Chronic diastolic CHF (congestive heart failure) (HCC) Patient actually came in slightly dry.  Echocardiogram noted grade 1 diastolic dysfunction.  Discussed with patient.  Already on ARB.  Given syncope likely related to orthostasis, I am hesitant to put him for now on a diuretic  Acute alcoholic intoxication in alcoholism with complication, episodic drinking behavior (HCC) As above.  Greatly advised to cut back if not stop his drinking.         Consultants: None Procedures performed: Echocardiogram noting grade 1 diastolic dysfunction Disposition: Home Diet recommendation:  Discharge Diet Orders (From admission, onward)     Start     Ordered   12/09/21 0000  Diet - low sodium heart healthy        12/09/21 1355           Cardiac diet DISCHARGE MEDICATION: Allergies as of 12/09/2021   No Known Allergies      Medication List     TAKE these medications     aspirin EC 81 MG tablet TAKE 1 TABLET BY MOUTH EVERY DAY   pantoprazole 40 MG tablet Commonly known as: PROTONIX TAKE 1 TABLET BY MOUTH EVERY DAY   ProAir RespiClick 108 (90 Base) MCG/ACT Aepb Generic drug: Albuterol Sulfate Inhale 2 puffs into the lungs every 4 (four) hours as needed (shortness of breath). What changed: Another medication with the same name was added. Make sure you understand how and when to take each.   albuterol 108 (90 Base) MCG/ACT inhaler Commonly known as: VENTOLIN HFA Inhale 2 puffs into the lungs every 6 (six) hours as needed for wheezing or shortness of breath. What changed: You were already taking a medication with the same name, and this prescription was added. Make sure you understand how and when to take each.   rosuvastatin 10 MG tablet Commonly known as: CRESTOR Take 1 tablet (10 mg total) by mouth daily.   valsartan 80 MG tablet Commonly known as: Diovan Take 1 tablet (80 mg total) by mouth daily. What changed:  medication strength how much to take   vitamin D3 50 MCG (2000 UT) Caps Take 1 capsule (2,000 Units total) by mouth daily.        Follow-up Information     Aaron Rigger, MD. Schedule an appointment as soon as possible for a visit.   Specialty: Pulmonary Disease Contact information: [address] [place] [address] [phone]         Aaron Cory, MD Follow up.   Specialty: Family Medicine Why: Keep schedu;ed appt Contact information: [address] [place] [address] [phone]                Discharge Exam: Ceasar Mons Weights   12/07/21 2133  Weight: 68 kg   General: Alert and oriented x3, no acute distress Cardiovascular: Regular rate and rhythm, S1-S2 Lungs: Clear to auscultation bilaterally  Condition at discharge: good  The results of significant diagnostics from this hospitalization (including imaging, microbiology, ancillary and laboratory) are listed below for  reference.   Imaging Studies: ECHOCARDIOGRAM COMPLETE  Result Date: 12/08/2021    ECHOCARDIOGRAM REPORT   Patient Name:   Aaron Ross Date of Exam: 12/08/2021 Medical Rec #:  [id number]   Height:       73.0 in Accession #:    [id number]  Weight:       150.0 lb Date of Birth:  02-07-1956   BSA:          1.904 m Patient Age:    65 years    BP:           142/105 mmHg Patient Gender: M           HR:           73 bpm. Exam Location:  [facility] Procedure: 2D Echo, Color Doppler and Cardiac Doppler Indications:     Syncope R55  History:         Patient has no prior history of Echocardiogram examinations.  Previous Myocardial Infarction, Signs/Symptoms:Murmur; Risk                  Factors:Hypertension.  Sonographer:     Cristela Blue Referring Phys:  [id number] Eduard Clos Diagnosing Phys: Alwyn Pea MD  Sonographer Comments: Suboptimal parasternal window and suboptimal apical window. IMPRESSIONS  1. Left ventricular ejection fraction, by estimation, is 55 to 60%. The left ventricle has normal function. The left ventricle has no regional wall motion abnormalities. Left ventricular diastolic parameters are consistent with Grade I diastolic dysfunction (impaired relaxation).  2. Right ventricular systolic function is normal. The right ventricular size is normal.  3. The mitral valve is normal in structure. Trivial mitral valve regurgitation.  4. The aortic valve is normal in structure. Aortic valve regurgitation is not visualized. FINDINGS  Left Ventricle: Left ventricular ejection fraction, by estimation, is 55 to 60%. The left ventricle has normal function. The left ventricle has no regional wall motion abnormalities. The left ventricular internal cavity size was normal in size. There is  no left ventricular hypertrophy. Left ventricular diastolic parameters are consistent with Grade I diastolic dysfunction (impaired relaxation). Right Ventricle: The right ventricular size is normal. No increase in  right ventricular wall thickness. Right ventricular systolic function is normal. Left Atrium: Left atrial size was normal in size. Right Atrium: Right atrial size was normal in size. Pericardium: There is no evidence of pericardial effusion. Mitral Valve: The mitral valve is normal in structure. Trivial mitral valve regurgitation. MV peak gradient, 4.2 mmHg. The mean mitral valve gradient is 1.0 mmHg. Tricuspid Valve: The tricuspid valve is normal in structure. Tricuspid valve regurgitation is mild. Aortic Valve: The aortic valve is normal in structure. Aortic valve regurgitation is not visualized. Aortic valve mean gradient measures 2.0 mmHg. Aortic valve peak gradient measures 3.8 mmHg. Aortic valve area, by VTI measures 2.28 cm. Pulmonic Valve: The pulmonic valve was normal in structure. Pulmonic valve regurgitation is trivial. Aorta: The ascending aorta was not well visualized. IAS/Shunts: No atrial level shunt detected by color flow Doppler.  LEFT VENTRICLE PLAX 2D LVIDd:         4.00 cm   Diastology LVIDs:         2.80 cm   LV e' medial:    6.74 cm/s LV PW:         1.00 cm   LV E/e' medial:  7.8 LV IVS:        1.10 cm   LV e' lateral:   9.90 cm/s LVOT diam:     2.10 cm   LV E/e' lateral: 5.3 LV SV:         40 LV SV Index:   21 LVOT Area:     3.46 cm  RIGHT VENTRICLE RV S prime:     12.90 cm/s LEFT ATRIUM             Index        RIGHT ATRIUM           Index LA diam:        3.20 cm 1.68 cm/m   RA Area:     18.20 cm LA Vol (A2C):   25.6 ml 13.45 ml/m  RA Volume:   52.60 ml  27.63 ml/m LA Vol (A4C):   18.7 ml 9.82 ml/m LA Biplane Vol: 22.8 ml 11.97 ml/m  AORTIC VALVE                    PULMONIC VALVE  AV Area (Vmax):    2.54 cm     PV Vmax:        0.60 m/s AV Area (Vmean):   2.20 cm     PV Vmean:       41.300 cm/s AV Area (VTI):     2.28 cm     PV VTI:         0.146 m AV Vmax:           96.90 cm/s   PV Peak grad:   1.5 mmHg AV Vmean:          66.200 cm/s  PV Mean grad:   1.0 mmHg AV VTI:             0.176 m      RVOT Peak grad: 2 mmHg AV Peak Grad:      3.8 mmHg AV Mean Grad:      2.0 mmHg LVOT Vmax:         71.10 cm/s LVOT Vmean:        42.000 cm/s LVOT VTI:          0.116 m LVOT/AV VTI ratio: 0.66  AORTA Ao Root diam: 3.73 cm MITRAL VALVE               TRICUSPID VALVE MV Area (PHT): 2.46 cm    TR Peak grad:   12.4 mmHg MV Area VTI:   1.82 cm    TR Vmax:        176.00 cm/s MV Peak grad:  4.2 mmHg MV Mean grad:  1.0 mmHg    SHUNTS MV Vmax:       1.02 m/s    Systemic VTI:  0.12 m MV Vmean:      50.9 cm/s   Systemic Diam: 2.10 cm MV Decel Time: 309 msec    Pulmonic VTI:  0.142 m MV E velocity: 52.70 cm/s MV A velocity: 93.40 cm/s MV E/A ratio:  0.56 Alwyn Pea MD Electronically signed by Alwyn Pea MD Signature Date/Time: 12/08/2021/7:11:45 PM    Final    CT Angio Chest PE W and/or Wo Contrast  Result Date: 12/08/2021 CLINICAL DATA:  Pulmonary embolism (PE) suspected, unknown D-dimer. Syncope. Left side rib pain. EXAM: CT ANGIOGRAPHY CHEST WITH CONTRAST TECHNIQUE: Multidetector CT imaging of the chest was performed using the standard protocol during bolus administration of intravenous contrast. Multiplanar CT image reconstructions and MIPs were obtained to evaluate the vascular anatomy. RADIATION DOSE REDUCTION: This exam was performed according to the departmental dose-optimization program which includes automated exposure control, adjustment of the mA and/or kV according to patient size and/or use of iterative reconstruction technique. CONTRAST:  51mL OMNIPAQUE IOHEXOL 350 MG/ML SOLN COMPARISON:  06/09/2021 FINDINGS: Cardiovascular: No filling defects in the pulmonary arteries to suggest pulmonary emboli. Heart is normal size. Scattered coronary artery and aortic calcifications. No aneurysm. Mediastinum/Nodes: No mediastinal, hilar, or axillary adenopathy. Trachea and esophagus are unremarkable. Thyroid unremarkable. Lungs/Pleura: Severe bullous emphysema. No confluent opacities or effusions.  Upper Abdomen: No acute findings Musculoskeletal: Fracture through the lateral left 4th and 5th ribs. Chest wall soft tissues are unremarkable. Review of the MIP images confirms the above findings. IMPRESSION: No evidence of pulmonary embolus. Coronary artery disease. Left lateral 4th and 5th rib fractures. Aortic Atherosclerosis (ICD10-I70.0) and Emphysema (ICD10-J43.9). Electronically Signed   By: Charlett Nose M.D.   On: 12/08/2021 00:15   CT Cervical Spine Wo Contrast  Result Date: 12/08/2021 CLINICAL DATA:  Neck trauma, intoxicated  or obtunded (Age >= 16y). Syncope. EXAM: CT CERVICAL SPINE WITHOUT CONTRAST TECHNIQUE: Multidetector CT imaging of the cervical spine was performed without intravenous contrast. Multiplanar CT image reconstructions were also generated. RADIATION DOSE REDUCTION: This exam was performed according to the departmental dose-optimization program which includes automated exposure control, adjustment of the mA and/or kV according to patient size and/or use of iterative reconstruction technique. COMPARISON:  None Available. FINDINGS: Alignment: Normal Skull base and vertebrae: No acute fracture. No primary bone lesion or focal pathologic process. Soft tissues and spinal canal: No prevertebral fluid or swelling. No visible canal hematoma. Disc levels:  Disc spaces maintained. Upper chest: Emphysema. Other: None IMPRESSION: No acute bony abnormality. Electronically Signed   By: Charlett Nose M.D.   On: 12/08/2021 00:12   DG Chest 2 View  Result Date: 12/07/2021 CLINICAL DATA:  Left ribcage pain.  Fell at home. EXAM: CHEST - 2 VIEW COMPARISON:  Chest CT 06/09/2021. FINDINGS: The cardiac size is normal. The aorta is tortuous with scattered calcifications, stable mediastinum. There is advanced emphysema with chronic bullous disease and scarring in the upper lobes. No infiltrate is seen. There is no pleural effusion or appreciable pneumothorax. There is osteopenia. There is new demonstration  of a slightly displaced fracture of the posterior left fifth rib which is likely acute and does not show callus formation. No further displaced rib fractures are visible on this nondedicated exam. There is thoracic kyphosis without appreciable spinal compression injury. IMPRESSION: 1. Slightly displaced transverse fracture of the posterior left fifth rib new from the prior CT, likely acute. 2. No visible pneumothorax.  Otherwise stable COPD chest. 3. Aortic atherosclerosis and uncoiling. Electronically Signed   By: Almira Bar M.D.   On: 12/07/2021 22:20   CT HEAD WO CONTRAST ( )  Result Date: 12/07/2021 CLINICAL DATA:  Minor head trauma.  66 year old male. EXAM: CT HEAD WITHOUT CONTRAST TECHNIQUE: Contiguous axial images were obtained from the base of the skull through the vertex without intravenous contrast. RADIATION DOSE REDUCTION: This exam was performed according to the departmental dose-optimization program which includes automated exposure control, adjustment of the mA and/or kV according to patient size and/or use of iterative reconstruction technique. COMPARISON:  Head CT 01/10/2021. FINDINGS: Brain: No evidence of acute infarction, hemorrhage, hydrocephalus, extra-axial collection or mass lesion/mass effect. Vascular: No hyperdense vessel or unexpected calcification. Skull: No fracture or focal lesion is seen. The calvarium, skull base and orbits are intact. Sinuses/Orbits: There is chronic opacification in the anterior right ethmoid sinus, mild thickening in the right frontal sinus. Other visible sinuses and the bilateral mastoid air cells are clear. The nasal septum is S shaped. Unremarkable orbital contents. Other: None. IMPRESSION: No acute intracranial CT findings, depressed skull fractures or interval changes. Electronically Signed   By: Almira Bar M.D.   On: 12/07/2021 22:15    Microbiology: Results for orders placed or performed in visit on 01/09/19  Novel Coronavirus, NAA  ([facility])     Status: None   Collection Time: 01/13/19 12:00 AM  Result Value Ref Range Status   SARS-CoV-2, NAA Not Detected Not Detected Final    Comment: Testing was performed using the cobas(R) SARS-CoV-2 test. This test was developed and its performance characteristics determined by [employer]. This test has not been FDA cleared or approved. This test has been authorized by FDA under an Emergency Use Authorization (EUA). This test is only authorized for the duration of time the declaration that circumstances exist justifying the authorization of the emergency  use of in vitro diagnostic tests for detection of SARS-CoV-2 virus and/or diagnosis of COVID-19 infection under section 564(b)(1) of the Act, 21 U.S.C. 657QIO-9(G)(2360bbb-3(b)(1), unless the authorization is terminated or revoked sooner. When diagnostic testing is negative, the possibility of a false negative result should be considered in the context of a patient's recent exposures and the presence of clinical signs and symptoms consistent with COVID-19. An individual without symptoms of COVID-19 and who is not shedding SARS-CoV-2 virus would expect to have a negati ve (not detected) result in this assay.     Labs: CBC: Recent Labs  Lab 12/07/21 2141 12/08/21 0333  WBC 6.8 9.2  HGB 14.8 13.4  HCT 45.6 40.7  MCV 95.4 94.0  PLT 228 207   Basic Metabolic Panel: Recent Labs  Lab 12/07/21 2141 12/07/21 2330 12/08/21 0333  NA 142  --  139  K 3.2*  --  3.8  CL 107  --  108  CO2 26  --  22  GLUCOSE 126*  --  134*  BUN 16  --  15  CREATININE 1.16  --  0.91  CALCIUM 9.2  --  8.4*  MG  --  2.0  --    Liver Function Tests: Recent Labs  Lab 12/07/21 2141  AST 43*  ALT 38  ALKPHOS 70  BILITOT 0.9  PROT 7.6  ALBUMIN 4.3   CBG: No results for input(s): "GLUCAP" in the last 168 hours.  Discharge time spent: greater than 30 minutes.  Signed: Hollice EspySendil K Staci Dack, MD [facility] 12/10/2021

## 2021-12-10 NOTE — Hospital Course (Signed)
66 year old male with past medical history of COPD, sleep apnea, hypertension and episodic alcohol use presented to the emergency room after multiple episodes in a row of syncope.  Patient had been drinking at home when he suddenly lost consciousness and fell hitting his head and left side of chest.  In the emergency room, noted to have an alcohol level of 156 and CT angiogram noted no evidence of pulmonary embolus, but did note left-sided rib fractures.  Noted to be mildly hypotensive and brought in for further evaluation.  He was also noted to be mildly hypoxic at first requiring 2 to 3 L nasal cannula.

## 2021-12-10 NOTE — Assessment & Plan Note (Addendum)
Patient actually came in slightly dry.  Echocardiogram noted grade 1 diastolic dysfunction.  Discussed with patient.  Already on ARB.  Given syncope likely related to orthostasis, I am hesitant to put him for now on a diuretic

## 2021-12-11 ENCOUNTER — Telehealth: Payer: Self-pay | Admitting: *Deleted

## 2021-12-11 NOTE — Telephone Encounter (Signed)
Transition Care Management Follow-up Telephone Call Date of discharge and from where: 12/09/21 How have you been since you were released from the hospital? "I'm fine" Any questions or concerns? No  Items Reviewed: Did the pt receive and understand the discharge instructions provided? Yes  Medications obtained and verified? Yes  Other? No  Any new allergies since your discharge? No  Dietary orders reviewed? Yes Do you have support at home? Yes - wife  Home Care and Equipment/Supplies: Were home health services ordered? no If so, what is the name of the agency? Not applicable  Has the agency set up a time to come to the patient's home? not applicable Were any new equipment or medical supplies ordered?  No What is the name of the medical supply agency? Not applicable Were you able to get the supplies/equipment? not applicable Do you have any questions related to the use of the equipment or supplies? Not applicable  Functional Questionnaire: (I = Independent and D = Dependent) ADLs: I  Bathing/Dressing- I  Meal Prep- I  Eating- I  Maintaining continence- I  Transferring/Ambulation- I  Managing Meds- I  Follow up appointments reviewed:  PCP Hospital f/u appt confirmed? Yes  Scheduled to see Dr. Carlynn Purl on 12/14/21 @ 8:00 am. Specialist Hospital f/u appt confirmed? No   Are transportation arrangements needed? No  If their condition worsens, is the pt aware to call PCP or go to the Emergency Dept.? Yes Was the patient provided with contact information for the PCP's office or ED? Yes Was to pt encouraged to call back with questions or concerns? Yes   Cranford Mon RN, CCM, CDCES [facility]  [facility] Care Management Coordinator  [phone]

## 2021-12-13 NOTE — Progress Notes (Signed)
Name: Aaron Ross   MRN: [id number]    DOB: Nov 08, 1955   Date:12/14/2021       Progress Note  Subjective  Chief Complaint  Follow Up  HPI  Hospital discharge follow up  Admission date: 12/07/2021  Discharge date: 12/09/2021  Reason for visit to [facility]: collapsed at home, second syncope episode in one year, both times with high alcohol blood level. He wakes up on his own, no bowel or bladder incontinence. This time upon arrival to [facility] his bp was low.  He fractured two ribs on left side of chest during collapse. He was found to be hypoxic , also diagnosed with CHF and advised to follow up with pulmonologist to treat and manage his COPD. He has a follow up scheduled with Dr. Karna Christmas tomorrow morning.   Alcoholism: He usually drinks about 3 shots of Bourbon of gin -  per day four days a week, he had two episodes of syncope associated with drinking. Explained importance of quitting or significantly decreasing alcohol intake.   Rib fracture: he is controlling pain with ibuprofen 800 mg otc twice daily , he states tylenol ineffective  Medication reconciliation done with patient, labs and studies reviewed    Echo 12/08/2021  IMPRESSIONS Left ventricular ejection fraction, by estimation, is 55 to 60%. The left ventricle has normal function. The left ventricle has no regional wall motion abnormalities. Left ventricular diastolic parameters are consistent with Grade I diastolic dysfunction (impaired relaxation). 1. 2. Right ventricular systolic function is normal. The right ventricular size is normal. 3. The mitral valve is normal in structure. Trivial mitral valve regurgitation. 4. The aortic valve is normal in structure. Aortic valve regurgitation is not visualized.   Patient Active Problem List   Diagnosis Date Noted   Acute alcoholic intoxication in alcoholism with complication, episodic drinking behavior (HCC) 12/10/2021   Chronic diastolic CHF (congestive heart failure) (HCC) 12/10/2021    Syncope 12/08/2021   Rib fracture 12/08/2021   Acute respiratory failure with hypoxia (HCC) 12/08/2021   Senile purpura (HCC) 11/03/2019   Lung nodules 11/03/2019   Atherosclerosis of aorta (HCC) 10/23/2017   History of pneumothorax 10/11/2016   Hyperlipidemia 11/29/2015   GERD (gastroesophageal reflux disease) 11/29/2015   HTN (hypertension) 01/11/2015   COPD (chronic obstructive pulmonary disease) (HCC) 01/11/2015   Erectile dysfunction 01/11/2015   Hyperglycemia 01/11/2015    Past Surgical History:  Procedure Laterality Date   COSMETIC SURGERY     EYE SURGERY     HERNIA REPAIR      Family History  Problem Relation Age of Onset   Hypertension Father    Prostate cancer Neg Hx    Bladder Cancer Neg Hx    Kidney cancer Neg Hx     Social History   Tobacco Use   Smoking status: Former    Packs/day: 2.00    Years: 43.00    Total pack years: 86.00    Types: Cigarettes    Start date: 06/25/1972    Quit date: 07/15/2017    Years since quitting: 4.4   Smokeless tobacco: Never  Substance Use Topics   Alcohol use: Yes    Alcohol/week: 8.0 standard drinks of alcohol    Types: 8 Standard drinks or equivalent per week     Current Outpatient Medications:    albuterol (VENTOLIN HFA) 108 (90 Base) MCG/ACT inhaler, Inhale 2 puffs into the lungs every 6 (six) hours as needed for wheezing or shortness of breath., Disp: 8 g, Rfl: 2   Albuterol  Sulfate (PROAIR RESPICLICK) 108 (90 Base) MCG/ACT AEPB, Inhale 2 puffs into the lungs every 4 (four) hours as needed (shortness of breath)., Disp: 1 each, Rfl: 3   aspirin 81 MG EC tablet, TAKE 1 TABLET BY MOUTH EVERY DAY, Disp: 30 tablet, Rfl: 0   Cholecalciferol (VITAMIN D3) 50 MCG (2000 UT) CAPS, Take 1 capsule (2,000 Units total) by mouth daily., Disp: 100 capsule, Rfl: 0   pantoprazole (PROTONIX) 40 MG tablet, TAKE 1 TABLET BY MOUTH EVERY DAY, Disp: 90 tablet, Rfl: 3   rosuvastatin (CRESTOR) 10 MG tablet, Take 1 tablet (10 mg total) by  mouth daily., Disp: 90 tablet, Rfl: 3   valsartan (DIOVAN) 80 MG tablet, Take 1 tablet (80 mg total) by mouth daily., Disp: 30 tablet, Rfl: 11  No Known Allergies  I personally reviewed active problem list, medication list, allergies, family history, social history, health maintenance with the patient/caregiver today.   ROS  Constitutional: Negative for fever or weight change.  Respiratory: Negative for cough and shortness of breath.   Cardiovascular: Negative for chest pain or palpitations.  Gastrointestinal: Negative for abdominal pain, no bowel changes.  Musculoskeletal: Negative for gait problem or joint swelling.  Skin: Negative for rash.  Neurological: Negative for dizziness or headache.  No other specific complaints in a complete review of systems (except as listed in HPI above).   Objective  Vitals:   12/14/21 0753  BP: 140/78  Pulse: 75  Resp: 16  SpO2: 93%  Weight: 163 lb (73.9 kg)  Height: 6\' 1"  (1.854 m)    Body mass index is 21.51 kg/m.  Physical Exam  Constitutional: Patient appears well-developed and well-nourished.  No distress.  HEENT: head atraumatic, normocephalic, pupils equal and reactive to light, neck supple Cardiovascular: Normal rate, regular rhythm and normal heart sounds.  No murmur heard. No BLE edema. Pulmonary/Chest: painful inspiration but non labored breathing,  and breath sounds normal. No respiratory distress. Tender to touch on left anterior rib cage  Abdominal: Soft.  There is no tenderness. Psychiatric: Patient has a normal mood and affect. behavior is normal. Judgment and thought content normal.   Recent Results (from the past 2160 hour(s))  CBC     Status: None   Collection Time: 12/07/21  9:41 PM  Result Value Ref Range   WBC 6.8 4.0 - 10.5 K/uL   RBC 4.78 4.22 - 5.81 MIL/uL   Hemoglobin 14.8 13.0 - 17.0 g/dL   HCT 12/09/21 14.7 - 82.9 %   MCV 95.4 80.0 - 100.0 fL   MCH 31.0 26.0 - 34.0 pg   MCHC 32.5 30.0 - 36.0 g/dL   RDW  56.2 13.0 - 86.5 %   Platelets 228 150 - 400 K/uL   nRBC 0.0 0.0 - 0.2 %    Comment: Performed at [facility], [address] [place], [place]  Comprehensive metabolic panel     Status: Abnormal   Collection Time: 12/07/21  9:41 PM  Result Value Ref Range   Sodium 142 135 - 145 mmol/L   Potassium 3.2 (L) 3.5 - 5.1 mmol/L   Chloride 107 98 - 111 mmol/L   CO2 26 22 - 32 mmol/L   Glucose, Bld 126 (H) 70 - 99 mg/dL    Comment: Glucose reference range applies only to samples taken after fasting for at least 8 hours.   BUN 16 8 - 23 mg/dL   Creatinine, Ser 12/09/21 0.61 - 1.24 mg/dL   Calcium 9.2 8.9 - 10.3  mg/dL   Total Protein 7.6 6.5 - 8.1 g/dL   Albumin 4.3 3.5 - 5.0 g/dL   AST 43 (H) 15 - 41 U/L   ALT 38 0 - 44 U/L   Alkaline Phosphatase 70 38 - 126 U/L   Total Bilirubin 0.9 0.3 - 1.2 mg/dL   GFR, Estimated >27 >03 mL/min    Comment: (NOTE) Calculated using the CKD-EPI Creatinine Equation (2021)    Anion gap 9 5 - 15    Comment: Performed at [facility], [address], [place], [place] [address]  Troponin I (High Sensitivity)     Status: None   Collection Time: 12/07/21  9:41 PM  Result Value Ref Range   Troponin I (High Sensitivity) 7 <18 ng/L    Comment: (NOTE) Elevated high sensitivity troponin I (hsTnI) values and significant  changes across serial measurements may suggest ACS but many other  chronic and acute conditions are known to elevate hsTnI results.  Refer to the "Links" section for chest pain algorithms and additional  guidance. Performed at [facility], [address] [place], [place] [address]   Ethanol     Status: Abnormal   Collection Time: 12/07/21  9:41 PM  Result Value Ref Range   Alcohol, Ethyl (B) 156 (H) <10 mg/dL    Comment: (NOTE) Lowest detectable limit for serum alcohol is 10 mg/dL.  For medical purposes only. Performed at [facility], [address] [place], [place] [address]   Troponin  I (High Sensitivity)     Status: None   Collection Time: 12/07/21 11:30 PM  Result Value Ref Range   Troponin I (High Sensitivity) 6 <18 ng/L    Comment: (NOTE) Elevated high sensitivity troponin I (hsTnI) values and significant  changes across serial measurements may suggest ACS but many other  chronic and acute conditions are known to elevate hsTnI results.  Refer to the "Links" section for chest pain algorithms and additional  guidance. Performed at [facility], [address] [place], [place] [address]   Magnesium     Status: None   Collection Time: 12/07/21 11:30 PM  Result Value Ref Range   Magnesium 2.0 1.7 - 2.4 mg/dL    Comment: Performed at [facility], [address] [place], [place] [address]  HIV Antibody (routine testing w rflx)     Status: None   Collection Time: 12/08/21  3:33 AM  Result Value Ref Range   HIV Screen 4th Generation wRfx Non Reactive Non Reactive    Comment: Performed at [facility], [address] [place], [place] [address]  Basic metabolic panel     Status: Abnormal   Collection Time: 12/08/21  3:33 AM  Result Value Ref Range   Sodium 139 135 - 145 mmol/L   Potassium 3.8 3.5 - 5.1 mmol/L   Chloride 108 98 - 111 mmol/L   CO2 22 22 - 32 mmol/L   Glucose, Bld 134 (H) 70 - 99 mg/dL    Comment: Glucose reference range applies only to samples taken after fasting for at least 8 hours.   BUN 15 8 - 23 mg/dL   Creatinine, Ser 5.85 0.61 - 1.24 mg/dL   Calcium 8.4 (L) 8.9 - 10.3 mg/dL   GFR, Estimated >27 >78 mL/min    Comment: (NOTE) Calculated using the CKD-EPI Creatinine Equation (2021)    Anion gap 9 5 - 15    Comment: Performed at [facility], [address]  [place], [place] [address]  CBC     Status: None   Collection Time: 12/08/21  3:33 AM  Result Value Ref Range   WBC 9.2 4.0 - 10.5 K/uL   RBC 4.33 4.22 - 5.81 MIL/uL   Hemoglobin 13.4 13.0 - 17.0 g/dL   HCT 09.840.7 11.939.0 - 14.752.0 %   MCV 94.0 80.0 -  100.0 fL   MCH 30.9 26.0 - 34.0 pg   MCHC 32.9 30.0 - 36.0 g/dL   RDW 82.913.0 56.211.5 - 13.015.5 %   Platelets 207 150 - 400 K/uL   nRBC 0.0 0.0 - 0.2 %    Comment: Performed at [facility], [address] [place], [place] [address]  Urinalysis, Routine w reflex microscopic     Status: Abnormal   Collection Time: 12/08/21 11:15 AM  Result Value Ref Range   Color, Urine YELLOW (A) YELLOW   APPearance CLEAR (A) CLEAR   Specific Gravity, Urine >1.046 (H) 1.005 - 1.030   pH 6.0 5.0 - 8.0   Glucose, UA 50 (A) NEGATIVE mg/dL   Hgb urine dipstick NEGATIVE NEGATIVE   Bilirubin Urine NEGATIVE NEGATIVE   Ketones, ur NEGATIVE NEGATIVE mg/dL   Protein, ur NEGATIVE NEGATIVE mg/dL   Nitrite NEGATIVE NEGATIVE   Leukocytes,Ua NEGATIVE NEGATIVE    Comment: Performed at [facility], [address] [place], [place] [address]  ECHOCARDIOGRAM COMPLETE     Status: None   Collection Time: 12/08/21  2:19 PM  Result Value Ref Range   Weight 2,400 oz   Height 73 in   BP 142/105 mmHg   Ao pk vel 0.97 m/s   AV Area VTI 2.28 cm2   AR max vel 2.54 cm2   AV Mean grad 2.0 mmHg   AV Peak grad 3.8 mmHg   S' Lateral 2.80 cm   AV Area mean vel 2.20 cm2   Area-P 1/2 2.46 cm2   MV VTI 1.82 cm2  Brain natriuretic peptide     Status: Abnormal   Collection Time: 12/09/21 11:47 AM  Result Value Ref Range   B Natriuretic Peptide 165.7 (H) 0.0 - 100.0 pg/mL    Comment: Performed at [facility], [address] [place], [place] [address]    PHQ2/9:    12/14/2021    7:52 AM 05/16/2021    9:50 AM 02/15/2021    1:37 PM 05/10/2020    9:07 AM 05/10/2020    9:06 AM  Depression screen PHQ 2/9  Decreased Interest 0 0 0 0 0  Down, Depressed, Hopeless 0 0 0 0 0  PHQ - 2 Score 0 0 0 0 0  Altered sleeping 0 0  1   Tired, decreased energy 0 0  0   Change in appetite 0 0  1   Feeling bad or failure about yourself  0 0  0   Trouble concentrating 0 0  0   Moving slowly or fidgety/restless 0 0   0   Suicidal thoughts 0 0  0   PHQ-9 Score 0 0  2   Difficult doing work/chores  Not difficult at all  Not difficult at all     phq 9 is negative   Fall Risk:    12/14/2021    7:51 AM 05/16/2021    9:47 AM 02/15/2021    1:37 PM 05/10/2020    9:06 AM 11/03/2019    7:40 AM  Fall Risk   Falls in the past year? 1 1 1  0 0  Number falls in past yr: 0 0 0 0 0  Injury with Fall? 1 1 1  0 0  Risk for fall due to : No Fall Risks  No Fall Risks    Follow up Falls prevention discussed  Falls prevention discussed  Falls evaluation completed      Functional Status Survey: Is the patient deaf or have difficulty hearing?: No Does the patient have difficulty seeing, even when wearing glasses/contacts?: No Does the patient have difficulty concentrating, remembering, or making decisions?: No Does the patient have difficulty walking or climbing stairs?: No Does the patient have difficulty dressing or bathing?: No Does the patient have difficulty doing errands alone such as visiting a doctor's office or shopping?: No    Assessment & Plan  1. Hospital discharge follow-up   2. Syncope and collapse  He seemed to be hypotensive upon arrival to [facility], he states he does not drink much water, discussed hydration, cutting down on alcohol intake. He was given lower dose of valsartan but he states still taking  160 mg since bp at home has been up. He states he will try to drink more water when he resumes drinking alcohol and will monitor his bp closely  3. Alcoholism [facility])  Discussed importance of quitting or cutting down   4. Atherosclerosis of aorta (HCC)  On statin therapy   5. Chronic diastolic CHF (congestive heart failure) (HCC)  Per Echo done at [facility], discussed referral to cardiologist but he would like to hold off for now   6. Closed fracture of multiple ribs of left side with routine healing, subsequent encounter  Pain controlled with otc medication   7. Panlobular emphysema  (HCC)  Keep follow up with pulmonologist tomorrow   8. OSA on CPAP    He resumed CPAP upon discharge of hospital

## 2021-12-14 ENCOUNTER — Encounter: Payer: Self-pay | Admitting: Family Medicine

## 2021-12-14 ENCOUNTER — Ambulatory Visit: Payer: 59 | Admitting: Family Medicine

## 2021-12-14 VITALS — BP 140/78 | HR 75 | Resp 16 | Ht 73.0 in | Wt 163.0 lb

## 2021-12-14 DIAGNOSIS — S2242XA Multiple fractures of ribs, left side, initial encounter for closed fracture: Secondary | ICD-10-CM | POA: Insufficient documentation

## 2021-12-14 DIAGNOSIS — I5032 Chronic diastolic (congestive) heart failure: Secondary | ICD-10-CM

## 2021-12-14 DIAGNOSIS — J431 Panlobular emphysema: Secondary | ICD-10-CM

## 2021-12-14 DIAGNOSIS — S2242XD Multiple fractures of ribs, left side, subsequent encounter for fracture with routine healing: Secondary | ICD-10-CM

## 2021-12-14 DIAGNOSIS — I700 Atherosclerosis of aorta: Secondary | ICD-10-CM | POA: Diagnosis not present

## 2021-12-14 DIAGNOSIS — G4733 Obstructive sleep apnea (adult) (pediatric): Secondary | ICD-10-CM

## 2021-12-14 DIAGNOSIS — F1020 Alcohol dependence, uncomplicated: Secondary | ICD-10-CM | POA: Diagnosis not present

## 2021-12-14 DIAGNOSIS — Z9989 Dependence on other enabling machines and devices: Secondary | ICD-10-CM

## 2021-12-14 DIAGNOSIS — Z09 Encounter for follow-up examination after completed treatment for conditions other than malignant neoplasm: Secondary | ICD-10-CM

## 2021-12-14 DIAGNOSIS — R55 Syncope and collapse: Secondary | ICD-10-CM | POA: Diagnosis not present

## 2021-12-14 MED ORDER — VALSARTAN 160 MG PO TABS: 80.0000 mg | ORAL_TABLET | Freq: Every day | ORAL | 0 refills | Status: DC

## 2022-01-26 NOTE — Progress Notes (Signed)
Name: Aaron Ross   MRN: [id number]    DOB: 1956/03/28   Date:01/29/2022       Progress Note  Subjective  Chief Complaint  Follow Up  HPI  HTN: currently taking diovan 80 mg and no dizziness, chest pain ( except for rib cage pain due to history of rib fracture)  or palpitation  CHF: diagnosed in June at [facility], he has SOB with mild activity, no orthopnea, denies lower extremity edema   IMPRESSIONS Echo 12/08/2021 Left ventricular ejection fraction, by estimation, is 55 to 60%. The left ventricle has normal function. The left ventricle has no regional wall motion abnormalities. Left ventricular diastolic parameters are consistent with Grade I diastolic dysfunction (impaired relaxation). 1. 2. Right ventricular systolic function is normal. The right ventricular size is normal. 3. The mitral valve is normal in structure. Trivial mitral valve regurgitation. 4. The aortic valve is normal in structure. Aortic valve regurgitation is not visualized.     OSA: under the care of pulmonologist Dr. Craige Cotta, and is wearing CPAP machine every night pressure of 6 cm H2O - he needs a new machine, currently seeing Dr. Karna Christmas   Atherosclerosis of Aorta: found on CXR done 10/13/2016, he is on statin therapy  Crestor and aspirin, Last LDL was 79    Hyperlipidemia: taking crestor and denies side effects of medications. We will recheck labs in Nov during his CPE   ED: he is doing well with Viagra , helps to start and maintain an erection He does not need refill at this time,takes it once a week   GERD: he has been taking pantoprazole for a long time , discussed side effects of medication including colitis, heart disease or osteoporosis. He states symptoms controlled with medication. He states symptoms resumes if he skips one dose    Emphysema: long history of tobacco use and emphysematous change on CXR done 10/11/2017. He quit smoking 06/2017, he denies wheezing, or cough,  walks his dog but now only able to  walk for 15 minutes, he states SOB is now all the time, getting progressively worse and is going to see pulmonologist at [facility] - referred by Dr. Karna Christmas. He is currently using an inhaler twice daily but not sure of the name, he will let me know when he gets home  Alcoholism: He usually drinks about 3 shots of Bourbon of gin -  per day four days a week, he had two episodes of syncope associated with drinking. Explained importance of quitting or significantly decreasing alcohol intake. Unchanged  Right thigh and groin pain: intermittently for years but getting worse, pain starts on right groin and radiates to right medial thigh. It is worse when he stands up after sitting for a prolonged period of time. It is mostly an uncomfortable feeling.   Left thumb injury: happened today while at work, he will notify his employer when they arrive at 8 am for workmans' comp claim   Senile purpura: on arms, on aspirin, reassurance given   Patient Active Problem List   Diagnosis Date Noted   Multiple closed fractures of ribs of left side 12/14/2021   Hospital discharge follow-up 12/14/2021   Panlobular emphysema (HCC) 12/14/2021   OSA on CPAP 12/14/2021   Alcoholism (HCC) 12/10/2021   Chronic diastolic CHF (congestive heart failure) (HCC) 12/10/2021   Syncope and collapse 12/08/2021   Rib fracture 12/08/2021   Acute respiratory failure with hypoxia (HCC) 12/08/2021   Senile purpura (HCC) 11/03/2019   Lung nodules 11/03/2019  History of pneumothorax 10/11/2016   Hyperlipidemia 11/29/2015   GERD (gastroesophageal reflux disease) 11/29/2015   HTN (hypertension) 01/11/2015   COPD (chronic obstructive pulmonary disease) (HCC) 01/11/2015   Erectile dysfunction 01/11/2015   Hyperglycemia 01/11/2015    Past Surgical History:  Procedure Laterality Date   COSMETIC SURGERY     EYE SURGERY     HERNIA REPAIR      Family History  Problem Relation Age of Onset   Hypertension Father    Prostate cancer  Neg Hx    Bladder Cancer Neg Hx    Kidney cancer Neg Hx     Social History   Tobacco Use   Smoking status: Former    Packs/day: 2.00    Years: 43.00    Total pack years: 86.00    Types: Cigarettes    Start date: 06/25/1972    Quit date: 07/15/2017    Years since quitting: 4.5   Smokeless tobacco: Never  Substance Use Topics   Alcohol use: Yes    Alcohol/week: 8.0 standard drinks of alcohol    Types: 8 Standard drinks or equivalent per week     Current Outpatient Medications:    albuterol (VENTOLIN HFA) 108 (90 Base) MCG/ACT inhaler, Inhale 2 puffs into the lungs every 6 (six) hours as needed for wheezing or shortness of breath., Disp: 8 g, Rfl: 2   aspirin 81 MG EC tablet, TAKE 1 TABLET BY MOUTH EVERY DAY, Disp: 30 tablet, Rfl: 0   Cholecalciferol (VITAMIN D3) 50 MCG (2000 UT) CAPS, Take 1 capsule (2,000 Units total) by mouth daily., Disp: 100 capsule, Rfl: 0   pantoprazole (PROTONIX) 40 MG tablet, TAKE 1 TABLET BY MOUTH EVERY DAY, Disp: 90 tablet, Rfl: 3   rosuvastatin (CRESTOR) 10 MG tablet, Take 1 tablet (10 mg total) by mouth daily., Disp: 90 tablet, Rfl: 3   valsartan (DIOVAN) 160 MG tablet, Take 0.5-1 tablets (80-160 mg total) by mouth daily., Disp: 90 tablet, Rfl: 0  No Known Allergies  I personally reviewed active problem list, medication list, allergies, family history, social history, health maintenance with the patient/caregiver today.   ROS  Constitutional: Negative for fever or weight change.  Respiratory: Negative for cough, positive for  shortness of breath.   Cardiovascular: Negative for chest pain or palpitations.  Gastrointestinal: Negative for abdominal pain, no bowel changes.  Musculoskeletal: Negative for gait problem or joint swelling.  Skin: Negative for rash.  Neurological: Negative for dizziness or headache.  No other specific complaints in a complete review of systems (except as listed in HPI above).   Objective  Vitals:   01/29/22 0745  BP:  130/78  Pulse: 78  Resp: 18  Temp: 98.2 F (36.8 C)  TempSrc: Oral  SpO2: 95%  Weight: 160 lb 9.6 oz (72.8 kg)  Height: 6\' 1"  (1.854 m)    Body mass index is 21.19 kg/m.  Physical Exam  Constitutional: Patient appears well-developed and thin.  No distress.  HEENT: head atraumatic, normocephalic, pupils equal and reactive to light, neck supple Cardiovascular: Normal rate, regular rhythm and normal heart sounds.  No murmur heard. No BLE edema. Pulmonary/Chest: Effort normal and breath sounds normal. No respiratory distress. Abdominal: Soft.  There is no tenderness. Psychiatric: Patient has a normal mood and affect. behavior is normal. Judgment and thought content normal.  Muscular skeletal: left thumb is wrapped in gauze, normal rom of hip, knee and spine  Recent Results (from the past 2160 hour(s))  CBC     Status:  None   Collection Time: 12/07/21  9:41 PM  Result Value Ref Range   WBC 6.8 4.0 - 10.5 K/uL   RBC 4.78 4.22 - 5.81 MIL/uL   Hemoglobin 14.8 13.0 - 17.0 g/dL   HCT 16.145.6 09.639.0 - 04.552.0 %   MCV 95.4 80.0 - 100.0 fL   MCH 31.0 26.0 - 34.0 pg   MCHC 32.5 30.0 - 36.0 g/dL   RDW 40.913.2 81.111.5 - 91.415.5 %   Platelets 228 150 - 400 K/uL   nRBC 0.0 0.0 - 0.2 %    Comment: Performed at [facility], [address] [place], [place] [address]  Comprehensive metabolic panel     Status: Abnormal   Collection Time: 12/07/21  9:41 PM  Result Value Ref Range   Sodium 142 135 - 145 mmol/L   Potassium 3.2 (L) 3.5 - 5.1 mmol/L   Chloride 107 98 - 111 mmol/L   CO2 26 22 - 32 mmol/L   Glucose, Bld 126 (H) 70 - 99 mg/dL    Comment: Glucose reference range applies only to samples taken after fasting for at least 8 hours.   BUN 16 8 - 23 mg/dL   Creatinine, Ser 6.211.16 0.61 - 1.24 mg/dL   Calcium 9.2 8.9 - 30.810.3 mg/dL   Total Protein 7.6 6.5 - 8.1 g/dL   Albumin 4.3 3.5 - 5.0 g/dL   AST 43 (H) 15 - 41 U/L   ALT 38 0 - 44 U/L   Alkaline Phosphatase 70 38 - 126 U/L   Total  Bilirubin 0.9 0.3 - 1.2 mg/dL   GFR, Estimated >65>60 >78>60 mL/min    Comment: (NOTE) Calculated using the CKD-EPI Creatinine Equation (2021)    Anion gap 9 5 - 15    Comment: Performed at [facility], [address] [place], [place] [address]  Troponin I (High Sensitivity)     Status: None   Collection Time: 12/07/21  9:41 PM  Result Value Ref Range   Troponin I (High Sensitivity) 7 <18 ng/L    Comment: (NOTE) Elevated high sensitivity troponin I (hsTnI) values and significant  changes across serial measurements may suggest ACS but many other  chronic and acute conditions are known to elevate hsTnI results.  Refer to the "Links" section for chest pain algorithms and additional  guidance. Performed at [facility], [address] [place], [place] [address]   Ethanol     Status: Abnormal   Collection Time: 12/07/21  9:41 PM  Result Value Ref Range   Alcohol, Ethyl (B) 156 (H) <10 mg/dL    Comment: (NOTE) Lowest detectable limit for serum alcohol is 10 mg/dL.  For medical purposes only. Performed at [facility], [address] [place], [place] [address]   Troponin I (High Sensitivity)     Status: None   Collection Time: 12/07/21 11:30 PM  Result Value Ref Range   Troponin I (High Sensitivity) 6 <18 ng/L    Comment: (NOTE) Elevated high sensitivity troponin I (hsTnI) values and significant  changes across serial measurements may suggest ACS but many other  chronic and acute conditions are known to elevate hsTnI results.  Refer to the "Links" section for chest pain algorithms and additional  guidance. Performed at [facility], [address] [place], [place] [address]   Magnesium     Status: None   Collection Time: 12/07/21 11:30 PM  Result Value Ref Range   Magnesium 2.0 1.7 - 2.4 mg/dL  Comment: Performed at [facility], [address] [place], [place] [address]  HIV Antibody (routine testing w rflx)     Status:  None   Collection Time: 12/08/21  3:33 AM  Result Value Ref Range   HIV Screen 4th Generation wRfx Non Reactive Non Reactive    Comment: Performed at [facility], [address] [place], [place] [address]  Basic metabolic panel     Status: Abnormal   Collection Time: 12/08/21  3:33 AM  Result Value Ref Range   Sodium 139 135 - 145 mmol/L   Potassium 3.8 3.5 - 5.1 mmol/L   Chloride 108 98 - 111 mmol/L   CO2 22 22 - 32 mmol/L   Glucose, Bld 134 (H) 70 - 99 mg/dL    Comment: Glucose reference range applies only to samples taken after fasting for at least 8 hours.   BUN 15 8 - 23 mg/dL   Creatinine, Ser 2.68 0.61 - 1.24 mg/dL   Calcium 8.4 (L) 8.9 - 10.3 mg/dL   GFR, Estimated >34 >19 mL/min    Comment: (NOTE) Calculated using the CKD-EPI Creatinine Equation (2021)    Anion gap 9 5 - 15    Comment: Performed at [facility], [address] [place], [place] [address]  CBC     Status: None   Collection Time: 12/08/21  3:33 AM  Result Value Ref Range   WBC 9.2 4.0 - 10.5 K/uL   RBC 4.33 4.22 - 5.81 MIL/uL   Hemoglobin 13.4 13.0 - 17.0 g/dL   HCT 79.8 92.1 - 19.4 %   MCV 94.0 80.0 - 100.0 fL   MCH 30.9 26.0 - 34.0 pg   MCHC 32.9 30.0 - 36.0 g/dL   RDW 17.4 08.1 - 44.8 %   Platelets 207 150 - 400 K/uL   nRBC 0.0 0.0 - 0.2 %    Comment: Performed at [facility], [address] [place], [place] [address]  Urinalysis, Routine w reflex microscopic     Status: Abnormal   Collection Time: 12/08/21 11:15 AM  Result Value Ref Range   Color, Urine YELLOW (A) YELLOW   APPearance CLEAR (A) CLEAR   Specific Gravity, Urine >1.046 (H) 1.005 - 1.030   pH 6.0 5.0 - 8.0   Glucose, UA 50 (A) NEGATIVE mg/dL   Hgb urine dipstick NEGATIVE NEGATIVE   Bilirubin Urine NEGATIVE NEGATIVE   Ketones, ur NEGATIVE NEGATIVE mg/dL   Protein, ur NEGATIVE NEGATIVE mg/dL   Nitrite NEGATIVE NEGATIVE   Leukocytes,Ua NEGATIVE NEGATIVE    Comment: Performed at [facility],  [address] [place], [place] [address]  ECHOCARDIOGRAM COMPLETE     Status: None   Collection Time: 12/08/21  2:19 PM  Result Value Ref Range   Weight 2,400 oz   Height 73 in   BP 142/105 mmHg   Ao pk vel 0.97 m/s   AV Area VTI 2.28 cm2   AR max vel 2.54 cm2   AV Mean grad 2.0 mmHg   AV Peak grad 3.8 mmHg   S' Lateral 2.80 cm   AV Area mean vel 2.20 cm2   Area-P 1/2 2.46 cm2   MV VTI 1.82 cm2  Brain natriuretic peptide     Status: Abnormal   Collection Time: 12/09/21 11:47 AM  Result Value Ref Range   B Natriuretic Peptide 165.7 (H) 0.0 - 100.0 pg/mL    Comment: Performed at [facility], [address] [place],  [place] [address]    PHQ2/9:    01/29/2022    7:47 AM 12/14/2021    7:52 AM 05/16/2021    9:50 AM 02/15/2021    1:37 PM 05/10/2020    9:07 AM  Depression screen PHQ 2/9  Decreased Interest 0 0 0 0 0  Down, Depressed, Hopeless 0 0 0 0 0  PHQ - 2 Score 0 0 0 0 0  Altered sleeping  0 0  1  Tired, decreased energy  0 0  0  Change in appetite  0 0  1  Feeling bad or failure about yourself   0 0  0  Trouble concentrating  0 0  0  Moving slowly or fidgety/restless  0 0  0  Suicidal thoughts  0 0  0  PHQ-9 Score  0 0  2  Difficult doing work/chores   Not difficult at all  Not difficult at all    phq 9 is negative   Fall Risk:    01/29/2022    7:47 AM 12/14/2021    7:51 AM 05/16/2021    9:47 AM 02/15/2021    1:37 PM 05/10/2020    9:06 AM  Fall Risk   Falls in the past year? 1 1 1 1  0  Number falls in past yr: 0 0 0 0 0  Injury with Fall? 1 1 1 1  0  Risk for fall due to : History of fall(s) No Fall Risks  No Fall Risks   Follow up Falls evaluation completed Falls prevention discussed  Falls prevention discussed       Functional Status Survey: Is the patient deaf or have difficulty hearing?: No Does the patient have difficulty seeing, even when wearing glasses/contacts?: No Does the patient have difficulty concentrating, remembering, or making  decisions?: No Does the patient have difficulty walking or climbing stairs?: No Does the patient have difficulty dressing or bathing?: No Does the patient have difficulty doing errands alone such as visiting a doctor's office or shopping?: No    Assessment & Plan  1. Primary hypertension  - valsartan (DIOVAN) 80 MG tablet; Take 1 tablet (80 mg total) by mouth daily.  Dispense: 90 tablet; Refill: 0  2. Panlobular emphysema (HCC)   3. Alcoholism (HCC)   4. Atherosclerosis of aorta (HCC)   5. Chronic diastolic CHF (congestive heart failure) (HCC)  - valsartan (DIOVAN) 80 MG tablet; Take 1 tablet (80 mg total) by mouth daily.  Dispense: 90 tablet; Refill: 0  6. Senile purpura (HCC)   Stable  7. OSA on CPAP   8. Pure hypercholesterolemia   9. Gastroesophageal reflux disease, unspecified whether esophagitis present   10. Right groin pain    Possible femoral hernia.

## 2022-01-29 ENCOUNTER — Other Ambulatory Visit: Payer: Self-pay

## 2022-01-29 ENCOUNTER — Ambulatory Visit: Payer: 59 | Admitting: Family Medicine

## 2022-01-29 ENCOUNTER — Encounter: Payer: Self-pay | Admitting: Family Medicine

## 2022-01-29 VITALS — BP 130/78 | HR 78 | Temp 98.2°F | Resp 18 | Ht 73.0 in | Wt 160.6 lb

## 2022-01-29 DIAGNOSIS — R1031 Right lower quadrant pain: Secondary | ICD-10-CM

## 2022-01-29 DIAGNOSIS — K219 Gastro-esophageal reflux disease without esophagitis: Secondary | ICD-10-CM

## 2022-01-29 DIAGNOSIS — I700 Atherosclerosis of aorta: Secondary | ICD-10-CM

## 2022-01-29 DIAGNOSIS — D692 Other nonthrombocytopenic purpura: Secondary | ICD-10-CM

## 2022-01-29 DIAGNOSIS — J431 Panlobular emphysema: Secondary | ICD-10-CM | POA: Diagnosis not present

## 2022-01-29 DIAGNOSIS — Z9989 Dependence on other enabling machines and devices: Secondary | ICD-10-CM

## 2022-01-29 DIAGNOSIS — I5032 Chronic diastolic (congestive) heart failure: Secondary | ICD-10-CM

## 2022-01-29 DIAGNOSIS — F1020 Alcohol dependence, uncomplicated: Secondary | ICD-10-CM | POA: Diagnosis not present

## 2022-01-29 DIAGNOSIS — I10 Essential (primary) hypertension: Secondary | ICD-10-CM

## 2022-01-29 DIAGNOSIS — G4733 Obstructive sleep apnea (adult) (pediatric): Secondary | ICD-10-CM

## 2022-01-29 DIAGNOSIS — E7800 Pure hypercholesterolemia, unspecified: Secondary | ICD-10-CM

## 2022-01-29 MED ORDER — VALSARTAN 80 MG PO TABS: 80.0000 mg | ORAL_TABLET | Freq: Every day | ORAL | 0 refills | Status: DC

## 2022-01-29 NOTE — Patient Instructions (Signed)
Dr. Craige Cotta  Dr. Murriel Hopper For CPAP supplies

## 2022-02-21 ENCOUNTER — Ambulatory Visit (INDEPENDENT_AMBULATORY_CARE_PROVIDER_SITE_OTHER): Payer: 59 | Admitting: Pulmonary Disease

## 2022-02-21 ENCOUNTER — Encounter: Payer: Self-pay | Admitting: Pulmonary Disease

## 2022-02-21 VITALS — BP 132/90 | Ht 73.0 in | Wt 161.2 lb

## 2022-02-21 DIAGNOSIS — G4733 Obstructive sleep apnea (adult) (pediatric): Secondary | ICD-10-CM | POA: Diagnosis not present

## 2022-02-21 NOTE — Patient Instructions (Signed)
Will arrange for new CPAP machine  Follow up in [place] office in 6 months

## 2022-02-21 NOTE — Progress Notes (Signed)
[facility]  Chief Complaint  Patient presents with   Follow-up    Follow-up: Need a new CPAP machine.    Past Surgical History:  He  has a past surgical history that includes Hernia repair; Cosmetic surgery; and Eye surgery.  Past Medical History:  GERD, HTN, OSA  Constitutional:  BP (!) 132/90 (BP Location: Left Arm)   Ht 6\' 1"  (1.854 m)   Wt 161 lb 3.2 oz (73.1 kg)   BMI 21.27 kg/m   Brief Summary:  Aaron Ross is a 66 y.o. male former smoker with COPD from emphysema, and obstructive sleep apnea.      Subjective:   His CPAP broke.  His sleep is terrible now.  He snores and feels like he can't breath at night.  He needs to get a new DME.  He has an appointment in the Fall at [facility] to meet a thoracic surgeon.  He is to be assessed for lung volume reduction surgery.  He was told he need additional pulmonary testing and pulmonary rehab prior to having surgery.  He was referred to [facility]. [facility] in [place] for this.    He gets winded easily.  He uses albuterol daily and this helps.  Physical Exam:   Appearance - well kempt   ENMT - no sinus tenderness, no oral exudate, no LAN, Mallampati 3 airway, no stridor  Respiratory - decreased breath sounds bilaterally, no wheezing or rales  CV - s1s2 regular rate and rhythm, no murmurs  Ext - no clubbing, no edema  Skin - no rashes  Psych - normal mood and affect   Pulmonary testing:  Spirometry 10/28/18 >> FEV1 2.92 (79%), FEV1% 62 A1AT 11/04/19 >> 113, MM  Chest Imaging:  LDCT chest 08/20/19 >> atherosclerosis, centrilobular and paraseptal emphysema, bullous changes, 10.2 mm lesion RUL stable, 14.9 mm lesion Lt costophrenic sulcus (was 17.9 mm) CT angio chest 12/07/21 >> severe bullous emphysema  Sleep Tests:  HST 01/06/20 >> AHI 33.1, SpO2 low 80%.  Cardiac Tests:  Echo 12/08/21 >> EF 55 to 60%, grade 1 DD  Social History:  He  reports that he quit smoking about 4  years ago. His smoking use included cigarettes. He started smoking about 49 years ago. He has a 86.00 pack-year smoking history. He has never used smokeless tobacco. He reports current alcohol use of about 8.0 standard drinks of alcohol per week. He reports that he does not use drugs.  Family History:  His family history includes Hypertension in his father.     Assessment/Plan:   Obstructive sleep apnea. - his current machine is from 2017, and he purchased this on his own at the time; it is no longer functioning - he would like to establish with a DME again in [place] - will arrange for new [employer] auto CPAP 5 to 8 cm H2O - explained he might need repeat sleep study for insurance coverage prior to getting his new CPAP machine  COPD with bullous emphysema. - he will see thoracic surgery at [facility] to discuss lung volume reduction surgery - he is working with [facility]. [facility] for management of his COPD and pulmonary rehab prior to determine about his fitness for surgery  Time Spent Involved in Patient Care on Day of Examination:  28 minutes  Follow up:   Patient Instructions  Will arrange for new CPAP machine  Follow up in [place] office in 6 months  Medication List:   Allergies as  of 02/21/2022   No Known Allergies      Medication List        Accurate as of February 21, 2022 11:34 AM. If you have any questions, ask your nurse or doctor.          albuterol 108 (90 Base) MCG/ACT inhaler Commonly known as: VENTOLIN HFA Inhale 2 puffs into the lungs every 6 (six) hours as needed for wheezing or shortness of breath.   aspirin EC 81 MG tablet TAKE 1 TABLET BY MOUTH EVERY DAY   pantoprazole 40 MG tablet Commonly known as: PROTONIX TAKE 1 TABLET BY MOUTH EVERY DAY   rosuvastatin 10 MG tablet Commonly known as: CRESTOR Take 1 tablet (10 mg total) by mouth daily.   valsartan 80 MG tablet Commonly known as: DIOVAN Take 1 tablet (80 mg total) by mouth daily.    vitamin D3 50 MCG (2000 UT) Caps Take 1 capsule (2,000 Units total) by mouth daily.        Signature:  Coralyn Helling, MD [facility] Pulmonary/Critical Care Pager - [phone] 02/21/2022, 11:34 AM

## 2022-03-14 ENCOUNTER — Other Ambulatory Visit: Payer: Self-pay | Admitting: Family Medicine

## 2022-03-14 ENCOUNTER — Encounter: Payer: Self-pay | Admitting: Family Medicine

## 2022-03-14 DIAGNOSIS — N529 Male erectile dysfunction, unspecified: Secondary | ICD-10-CM

## 2022-03-14 MED ORDER — SILDENAFIL CITRATE 100 MG PO TABS: 50.0000 mg | ORAL_TABLET | Freq: Every day | ORAL | 0 refills | Status: DC | PRN

## 2022-05-21 NOTE — Patient Instructions (Signed)
Preventive Care 65 Years and Older, Male Preventive care refers to lifestyle choices and visits with your health care provider that can promote health and wellness. Preventive care visits are also called wellness exams. What can I expect for my preventive care visit? Counseling During your preventive care visit, your health care provider may ask about your: Medical history, including: Past medical problems. Family medical history. History of falls. Current health, including: Emotional well-being. Home life and relationship well-being. Sexual activity. Memory and ability to understand (cognition). Lifestyle, including: Alcohol, nicotine or tobacco, and drug use. Access to firearms. Diet, exercise, and sleep habits. Work and work environment. Sunscreen use. Safety issues such as seatbelt and bike helmet use. Physical exam Your health care provider will check your: Height and weight. These may be used to calculate your BMI (body mass index). BMI is a measurement that tells if you are at a healthy weight. Waist circumference. This measures the distance around your waistline. This measurement also tells if you are at a healthy weight and may help predict your risk of certain diseases, such as type 2 diabetes and high blood pressure. Heart rate and blood pressure. Body temperature. Skin for abnormal spots. What immunizations do I need?  Vaccines are usually given at various ages, according to a schedule. Your health care provider will recommend vaccines for you based on your age, medical history, and lifestyle or other factors, such as travel or where you work. What tests do I need? Screening Your health care provider may recommend screening tests for certain conditions. This may include: Lipid and cholesterol levels. Diabetes screening. This is done by checking your blood sugar (glucose) after you have not eaten for a while (fasting). Hepatitis C test. Hepatitis B test. HIV (human  immunodeficiency virus) test. STI (sexually transmitted infection) testing, if you are at risk. Lung cancer screening. Colorectal cancer screening. Prostate cancer screening. Abdominal aortic aneurysm (AAA) screening. You may need this if you are a current or former smoker. Talk with your health care provider about your test results, treatment options, and if necessary, the need for more tests. Follow these instructions at home: Eating and drinking  Eat a diet that includes fresh fruits and vegetables, whole grains, lean protein, and low-fat dairy products. Limit your intake of foods with high amounts of sugar, saturated fats, and salt. Take vitamin and mineral supplements as recommended by your health care provider. Do not drink alcohol if your health care provider tells you not to drink. If you drink alcohol: Limit how much you have to 0-2 drinks a day. Know how much alcohol is in your drink. In the [place] one drink equals one 12 oz bottle of beer (355 mL), one 5 oz glass of wine (148 mL), or one 1 oz glass of hard liquor (44 mL). Lifestyle Brush your teeth every morning and night with fluoride toothpaste. Floss one time each day. Exercise for at least 30 minutes 5 or more days each week. Do not use any products that contain nicotine or tobacco. These products include cigarettes, chewing tobacco, and vaping devices, such as e-cigarettes. If you need help quitting, ask your health care provider. Do not use drugs. If you are sexually active, practice safe sex. Use a condom or other form of protection to prevent STIs. Take aspirin only as told by your health care provider. Make sure that you understand how much to take and what form to take. Work with your health care provider to find out whether it is safe   and beneficial for you to take aspirin daily. Ask your health care provider if you need to take a cholesterol-lowering medicine (statin). Find healthy ways to manage stress, such  as: Meditation, yoga, or listening to music. Journaling. Talking to a trusted person. Spending time with friends and family. Safety Always wear your seat belt while driving or riding in a vehicle. Do not drive: If you have been drinking alcohol. Do not ride with someone who has been drinking. When you are tired or distracted. While texting. If you have been using any mind-altering substances or drugs. Wear a helmet and other protective equipment during sports activities. If you have firearms in your house, make sure you follow all gun safety procedures. Minimize exposure to UV radiation to reduce your risk of skin cancer. What's next? Visit your health care provider once a year for an annual wellness visit. Ask your health care provider how often you should have your eyes and teeth checked. Stay up to date on all vaccines. This information is not intended to replace advice given to you by your health care provider. Make sure you discuss any questions you have with your health care provider. Document Revised: 12/07/2020 Document Reviewed: 12/07/2020 Elsevier Patient Education  2023 Elsevier Inc.  

## 2022-05-21 NOTE — Progress Notes (Unsigned)
Name: Aaron Ross   MRN: [id number]    DOB: Oct 08, 1955   Date:05/22/2022       Progress Note  Subjective  Chief Complaint  Annual Exam  HPI  Patient presents for annual CPE.  IPSS Questionnaire (AUA-7): Over the past month.   1)  How often have you had a sensation of not emptying your bladder completely after you finish urinating?  0 - Not at all  2)  How often have you had to urinate again less than two hours after you finished urinating? 0 - Not at all  3)  How often have you found you stopped and started again several times when you urinated?  0 - Not at all  4) How difficult have you found it to postpone urination?  0 - Not at all  5) How often have you had a weak urinary stream?  0 - Not at all  6) How often have you had to push or strain to begin urination?  0 - Not at all  7) How many times did you most typically get up to urinate from the time you went to bed until the time you got up in the morning?  0 - None  Total score:  0-7 mildly symptomatic   8-19 moderately symptomatic   20-35 severely symptomatic     Diet: eats mostly at home, eats out once a week Exercise: walks daily with the dog - average 45 minutes  Last Dental Exam: up to date - goes every 6 months  Last Eye Exam: up to date   Depression: phq 9 is negative    05/22/2022    7:49 AM 01/29/2022    7:47 AM 12/14/2021    7:52 AM 05/16/2021    9:50 AM 02/15/2021    1:37 PM  Depression screen PHQ 2/9  Decreased Interest 0 0 0 0 0  Down, Depressed, Hopeless 0 0 0 0 0  PHQ - 2 Score 0 0 0 0 0  Altered sleeping 0  0 0   Tired, decreased energy 0  0 0   Change in appetite 0  0 0   Feeling bad or failure about yourself  0  0 0   Trouble concentrating 0  0 0   Moving slowly or fidgety/restless 0  0 0   Suicidal thoughts 0  0 0   PHQ-9 Score 0  0 0   Difficult doing work/chores    Not difficult at all     Hypertension:  BP Readings from Last 3 Encounters:  05/22/22 136/88  02/21/22 (!) 132/90  01/29/22  130/78    Obesity: Wt Readings from Last 3 Encounters:  05/22/22 165 lb (74.8 kg)  02/21/22 161 lb 3.2 oz (73.1 kg)  01/29/22 160 lb 9.6 oz (72.8 kg)   BMI Readings from Last 3 Encounters:  05/22/22 21.77 kg/m  02/21/22 21.27 kg/m  01/29/22 21.19 kg/m     Lipids:  Lab Results  Component Value Date   CHOL 182 05/16/2021   CHOL 191 05/10/2020   CHOL 173 11/03/2019   Lab Results  Component Value Date   HDL 84 05/16/2021   HDL 76 05/10/2020   HDL 81 11/03/2019   Lab Results  Component Value Date   LDLCALC 79 05/16/2021   LDLCALC 88 05/10/2020   LDLCALC 79 11/03/2019   Lab Results  Component Value Date   TRIG 100 05/16/2021   TRIG 169 (H) 05/10/2020   TRIG 52 11/03/2019   Lab  Results  Component Value Date   CHOLHDL 2.2 05/16/2021   CHOLHDL 2.5 05/10/2020   CHOLHDL 2.1 11/03/2019   No results found for: "LDLDIRECT" Glucose:  Glucose, Bld  Date Value Ref Range Status  12/08/2021 134 (H) 70 - 99 mg/dL Final    Comment:    Glucose reference range applies only to samples taken after fasting for at least 8 hours.  12/07/2021 126 (H) 70 - 99 mg/dL Final    Comment:    Glucose reference range applies only to samples taken after fasting for at least 8 hours.  01/30/2021 126 (H) 70 - 99 mg/dL Final    Comment:    Glucose reference range applies only to samples taken after fasting for at least 8 hours.   Glucose-Capillary  Date Value Ref Range Status  01/10/2021 116 (H) 70 - 99 mg/dL Final    Comment:    Glucose reference range applies only to samples taken after fasting for at least 8 hours.  06/04/2019 78 70 - 99 mg/dL Final    Flowsheet Row Office Visit from 05/22/2022 in [facility]  AUDIT-C Score 7      Married STD testing and prevention (HIV/chl/gon/syphilis): 12/08/21 Sexual history: one partner, married  Hep C Screening: 10/23/17 Skin cancer: Discussed monitoring for atypical lesions Colorectal cancer: 06/07/21 Prostate  cancer:   Lab Results  Component Value Date   PSA 1.42 05/16/2021     Lung cancer:  Low Dose CT Chest recommended if Age 21-80 years, 30 pack-year currently smoking OR have quit w/in 15years. Patient  no a candidate for screening   AAA: The USPSTF recommends one-time screening with ultrasonography in men ages 68 to 25 years who have ever smoked. Patient   no, a candidate for screening  ECG: 12/11/21  Vaccines:   RSV: discussed getting at the local pharmacy  Tdap: up to date Shingrix: up to date Pneumonia: up to date Flu: up to date COVID-67: up to date  Advanced Care Planning: A voluntary discussion about advance care planning including the explanation and discussion of advance directives.  Discussed health care proxy and Living will, and the patient was able to identify a health care proxy as wife.  Patient  has a living will and power of attorney of health care   Patient Active Problem List   Diagnosis Date Noted   Multiple closed fractures of ribs of left side 12/14/2021   Hospital discharge follow-up 12/14/2021   Panlobular emphysema ([place]) 12/14/2021   OSA on CPAP 12/14/2021   Alcoholism ([place]) 12/10/2021   Chronic diastolic CHF (congestive heart failure) ([place]) 12/10/2021   Syncope and collapse 12/08/2021   Rib fracture 12/08/2021   Senile purpura ([place]) 11/03/2019   Lung nodules 11/03/2019   History of pneumothorax 10/11/2016   Hyperlipidemia 11/29/2015   GERD (gastroesophageal reflux disease) 11/29/2015   HTN (hypertension) 01/11/2015   COPD (chronic obstructive pulmonary disease) ([place]) 01/11/2015   Erectile dysfunction 01/11/2015   Hyperglycemia 01/11/2015    Past Surgical History:  Procedure Laterality Date   COSMETIC SURGERY     EYE SURGERY     HERNIA REPAIR      Family History  Problem Relation Age of Onset   Hypertension Father    Prostate cancer Neg Hx    Bladder Cancer Neg Hx    Kidney cancer Neg Hx     Social History   Socioeconomic History    Marital status: Married    Spouse name: Not on file  Number of children: 0   Years of education: Not on file   Highest education level: 9th grade  Occupational History   Occupation: Customer service manager   Tobacco Use   Smoking status: Former    Packs/day: 2.00    Years: 43.00    Total pack years: 86.00    Types: Cigarettes    Start date: 06/25/1972    Quit date: 07/15/2017    Years since quitting: 4.8   Smokeless tobacco: Never  Vaping Use   Vaping Use: Never used  Substance and Sexual Activity   Alcohol use: Yes    Alcohol/week: 8.0 standard drinks of alcohol    Types: 8 Standard drinks or equivalent per week   Drug use: No   Sexual activity: Yes    Partners: Female    Birth control/protection: Surgical  Other Topics Concern   Not on file  Social History Narrative   Divorced, never had children, remarried January 2020, she is from [place]    He works full time in [place], Press photographer   He is originally from [place] Moved here with parents and siblings when he was 16.    Siblings still in [place]   Social Determinants of Health   Financial Resource Strain: Low Risk  (05/22/2022)   Overall Financial Resource Strain (CARDIA)    Difficulty of Paying Living Expenses: Not hard at all  Food Insecurity: No Food Insecurity (05/22/2022)   Hunger Vital Sign    Worried About Running Out of Food in the Last Year: Never true    Ran Out of Food in the Last Year: Never true  Transportation Needs: No Transportation Needs (05/22/2022)   PRAPARE - Administrator, Civil Service (Medical): No    Lack of Transportation (Non-Medical): No  Physical Activity: Sufficiently Active (05/22/2022)   Exercise Vital Sign    Days of Exercise per Week: 6 days    Minutes of Exercise per Session: 40 min  Stress: No Stress Concern Present (05/22/2022)   [employer] of Occupational Health - Occupational Stress Questionnaire    Feeling of Stress : Not at all  Social Connections: Socially Isolated  (05/22/2022)   Social Connection and Isolation Panel [NHANES]    Frequency of Communication with Friends and Family: Once a week    Frequency of Social Gatherings with Friends and Family: Once a week    Attends Religious Services: Never    Database administrator or Organizations: No    Attends Banker Meetings: Never    Marital Status: Married  Catering manager Violence: Not At Risk (05/22/2022)   Humiliation, Afraid, Rape, and Kick questionnaire    Fear of Current or Ex-Partner: No    Emotionally Abused: No    Physically Abused: No    Sexually Abused: No     Current Outpatient Medications:    albuterol (VENTOLIN HFA) 108 (90 Base) MCG/ACT inhaler, Inhale 2 puffs into the lungs every 6 (six) hours as needed for wheezing or shortness of breath., Disp: 8 g, Rfl: 2   aspirin 81 MG EC tablet, TAKE 1 TABLET BY MOUTH EVERY DAY, Disp: 30 tablet, Rfl: 0   Cholecalciferol (VITAMIN D3) 50 MCG (2000 UT) CAPS, Take 1 capsule (2,000 Units total) by mouth daily., Disp: 100 capsule, Rfl: 0   pantoprazole (PROTONIX) 40 MG tablet, TAKE 1 TABLET BY MOUTH EVERY DAY, Disp: 90 tablet, Rfl: 3   rosuvastatin (CRESTOR) 10 MG tablet, Take 1 tablet (10 mg total) by mouth daily., Disp:  90 tablet, Rfl: 3   sildenafil (VIAGRA) 100 MG tablet, Take 0.5-1 tablets (50-100 mg total) by mouth daily as needed for erectile dysfunction., Disp: 30 tablet, Rfl: 0   valsartan (DIOVAN) 80 MG tablet, Take 1 tablet (80 mg total) by mouth daily., Disp: 90 tablet, Rfl: 0  No Known Allergies   ROS  Constitutional: Negative for fever or weight change.  Respiratory: Negative for cough and shortness of breath.   Cardiovascular: Negative for chest pain or palpitations.  Gastrointestinal: Negative for abdominal pain, no bowel changes.  Musculoskeletal: Negative for gait problem or joint swelling.  Skin: Negative for rash.  Neurological: Negative for dizziness or headache.  No other specific complaints in a complete  review of systems (except as listed in HPI above).    Objective  Vitals:   05/22/22 0749  BP: 136/88  Pulse: 80  Resp: 16  SpO2: 99%  Weight: 165 lb (74.8 kg)  Height: 6\' 1"  (1.854 m)    Body mass index is 21.77 kg/m.  Physical Exam  Constitutional: Patient appears well-developed and well-nourished. No distress.  HENT: Head: Normocephalic and atraumatic. Ears: B TMs ok, no erythema or effusion; Nose: Nose normal. Mouth/Throat: Oropharynx is clear and moist. No oropharyngeal exudate.  Eyes: Conjunctivae and EOM are normal. Pupils are equal, round, and reactive to light. No scleral icterus.  Neck: Normal range of motion. Neck supple. No JVD present. No thyromegaly present.  Cardiovascular: Normal rate, regular rhythm and normal heart sounds.  No murmur heard. No BLE edema. Pulmonary/Chest: Effort normal and breath sounds normal. No respiratory distress. Abdominal: Soft. Bowel sounds are normal, no distension. There is no tenderness. no masses MALE GENITALIA: Normal descended testes bilaterally, no masses palpated, no hernias, no lesions, no discharge RECTAL: not done Musculoskeletal: Normal range of motion, no joint effusions. No gross deformities Neurological: he is alert and oriented to person, place, and time. No cranial nerve deficit. Coordination, balance, strength, speech and gait are normal.  Skin: Skin is warm and dry. No rash noted. No erythema.  Psychiatric: Patient has a normal mood and affect. behavior is normal. Judgment and thought content normal.   Fall Risk:    05/22/2022    7:49 AM 01/29/2022    7:47 AM 12/14/2021    7:51 AM 05/16/2021    9:47 AM 02/15/2021    1:37 PM  Fall Risk   Falls in the past year? 1 1 1 1 1   Number falls in past yr: 0 0 0 0 0  Injury with Fall? 1 1 1 1 1   Risk for fall due to : No Fall Risks History of fall(s) No Fall Risks  No Fall Risks  Follow up Falls prevention discussed Falls evaluation completed Falls prevention discussed  Falls  prevention discussed     Functional Status Survey: Is the patient deaf or have difficulty hearing?: No Does the patient have difficulty seeing, even when wearing glasses/contacts?: No Does the patient have difficulty concentrating, remembering, or making decisions?: No Does the patient have difficulty walking or climbing stairs?: No Does the patient have difficulty dressing or bathing?: No Does the patient have difficulty doing errands alone such as visiting a doctor's office or shopping?: No    Assessment & Plan  1. Well adult exam  - Lipid panel - COMPLETE METABOLIC PANEL WITH GFR - PSA  2. Hyperlipidemia, unspecified hyperlipidemia type  - Lipid panel  3. Screening for prostate cancer  - PSA  4. Long-term use of high-risk medication  -  COMPLETE METABOLIC PANEL WITH GFR     -Prostate cancer screening and PSA options (with potential risks and benefits of testing vs not testing) were discussed along with recent recs/guidelines. -USPSTF grade A and B recommendations reviewed with patient; age-appropriate recommendations, preventive care, screening tests, etc discussed and encouraged; healthy living encouraged; see AVS for patient education given to patient -Discussed importance of 150 minutes of physical activity weekly, eat two servings of fish weekly, eat one serving of tree nuts ( cashews, pistachios, pecans, almonds.Marland Kitchen) every other day, eat 6 servings of fruit/vegetables daily and drink plenty of water and avoid sweet beverages.  -Reviewed Health Maintenance: yes

## 2022-05-22 ENCOUNTER — Encounter: Payer: Self-pay | Admitting: Family Medicine

## 2022-05-22 ENCOUNTER — Other Ambulatory Visit: Payer: Self-pay

## 2022-05-22 ENCOUNTER — Ambulatory Visit (INDEPENDENT_AMBULATORY_CARE_PROVIDER_SITE_OTHER): Payer: 59 | Admitting: Family Medicine

## 2022-05-22 VITALS — BP 136/88 | HR 80 | Resp 16 | Ht 73.0 in | Wt 165.0 lb

## 2022-05-22 DIAGNOSIS — Z125 Encounter for screening for malignant neoplasm of prostate: Secondary | ICD-10-CM

## 2022-05-22 DIAGNOSIS — Z79899 Other long term (current) drug therapy: Secondary | ICD-10-CM | POA: Diagnosis not present

## 2022-05-22 DIAGNOSIS — E785 Hyperlipidemia, unspecified: Secondary | ICD-10-CM | POA: Diagnosis not present

## 2022-05-22 DIAGNOSIS — Z0000 Encounter for general adult medical examination without abnormal findings: Secondary | ICD-10-CM

## 2022-05-22 DIAGNOSIS — K219 Gastro-esophageal reflux disease without esophagitis: Secondary | ICD-10-CM

## 2022-05-22 MED ORDER — PANTOPRAZOLE SODIUM 40 MG PO TBEC: 40.0000 mg | DELAYED_RELEASE_TABLET | Freq: Every day | ORAL | 3 refills | Status: DC

## 2022-05-23 LAB — COMPLETE METABOLIC PANEL WITH GFR
AG Ratio: 1.5 (calc) (ref 1.0–2.5)
ALT: 35 U/L (ref 9–46)
AST: 39 U/L — ABNORMAL HIGH (ref 10–35)
Albumin: 4.6 g/dL (ref 3.6–5.1)
Alkaline phosphatase (APISO): 80 U/L (ref 35–144)
BUN: 21 mg/dL (ref 7–25)
CO2: 28 mmol/L (ref 20–32)
Calcium: 9.8 mg/dL (ref 8.6–10.3)
Chloride: 101 mmol/L (ref 98–110)
Creat: 1.05 mg/dL (ref 0.70–1.35)
Globulin: 3 g/dL (calc) (ref 1.9–3.7)
Glucose, Bld: 95 mg/dL (ref 65–99)
Potassium: 4.2 mmol/L (ref 3.5–5.3)
Sodium: 140 mmol/L (ref 135–146)
Total Bilirubin: 0.8 mg/dL (ref 0.2–1.2)
Total Protein: 7.6 g/dL (ref 6.1–8.1)
eGFR: 79 mL/min/{1.73_m2} (ref >=60)

## 2022-05-23 LAB — PSA: PSA: 1.69 ng/mL (ref <=4.00)

## 2022-05-23 LAB — LIPID PANEL
Cholesterol: 196 mg/dL (ref <200)
HDL: 82 mg/dL (ref >=40)
LDL Cholesterol (Calc): 91 mg/dL (calc)
Non-HDL Cholesterol (Calc): 114 mg/dL (calc) (ref <130)
Total CHOL/HDL Ratio: 2.4 (calc) (ref <5.0)
Triglycerides: 133 mg/dL (ref <150)

## 2022-05-24 ENCOUNTER — Other Ambulatory Visit: Payer: Self-pay

## 2022-05-24 ENCOUNTER — Encounter: Payer: Self-pay | Admitting: Family Medicine

## 2022-05-24 DIAGNOSIS — N529 Male erectile dysfunction, unspecified: Secondary | ICD-10-CM

## 2022-05-24 DIAGNOSIS — I10 Essential (primary) hypertension: Secondary | ICD-10-CM

## 2022-05-24 DIAGNOSIS — E7800 Pure hypercholesterolemia, unspecified: Secondary | ICD-10-CM

## 2022-05-24 DIAGNOSIS — K219 Gastro-esophageal reflux disease without esophagitis: Secondary | ICD-10-CM

## 2022-05-24 DIAGNOSIS — I5032 Chronic diastolic (congestive) heart failure: Secondary | ICD-10-CM

## 2022-05-25 MED ORDER — VALSARTAN 80 MG PO TABS: 80.0000 mg | ORAL_TABLET | Freq: Every day | ORAL | 0 refills | Status: DC

## 2022-05-25 MED ORDER — ROSUVASTATIN CALCIUM 10 MG PO TABS: 10.0000 mg | ORAL_TABLET | Freq: Every day | ORAL | 0 refills | Status: DC

## 2022-05-25 MED ORDER — PANTOPRAZOLE SODIUM 40 MG PO TBEC: 40.0000 mg | DELAYED_RELEASE_TABLET | Freq: Every day | ORAL | 0 refills | Status: DC

## 2022-05-25 MED ORDER — SILDENAFIL CITRATE 100 MG PO TABS: 50.0000 mg | ORAL_TABLET | Freq: Every day | ORAL | 0 refills | Status: DC | PRN

## 2022-06-05 ENCOUNTER — Encounter: Payer: Self-pay | Admitting: Family Medicine

## 2022-06-05 ENCOUNTER — Other Ambulatory Visit: Payer: Self-pay

## 2022-06-05 DIAGNOSIS — K219 Gastro-esophageal reflux disease without esophagitis: Secondary | ICD-10-CM

## 2022-06-05 MED ORDER — PANTOPRAZOLE SODIUM 40 MG PO TBEC: 40.0000 mg | DELAYED_RELEASE_TABLET | Freq: Every day | ORAL | 0 refills | Status: DC

## 2022-06-11 ENCOUNTER — Ambulatory Visit
Admission: RE | Admit: 2022-06-11 | Discharge: 2022-06-11 | Disposition: A | Discharge status: Home / Self Care | Payer: 59 | Source: Ambulatory Visit | Attending: Acute Care | Admitting: Acute Care

## 2022-06-11 DIAGNOSIS — Z87891 Personal history of nicotine dependence: Secondary | ICD-10-CM | POA: Insufficient documentation

## 2022-06-15 ENCOUNTER — Other Ambulatory Visit: Payer: Self-pay

## 2022-06-15 DIAGNOSIS — Z122 Encounter for screening for malignant neoplasm of respiratory organs: Secondary | ICD-10-CM

## 2022-06-15 DIAGNOSIS — Z87891 Personal history of nicotine dependence: Secondary | ICD-10-CM

## 2022-07-13 ENCOUNTER — Other Ambulatory Visit: Payer: Self-pay | Admitting: Family Medicine

## 2022-07-13 DIAGNOSIS — E7800 Pure hypercholesterolemia, unspecified: Secondary | ICD-10-CM

## 2022-07-13 NOTE — Telephone Encounter (Signed)
Requested Prescriptions  Pending Prescriptions Disp Refills   rosuvastatin (CRESTOR) 10 MG tablet [Pharmacy Med Name: ROSUVASTATIN CALCIUM 10 MG TAB] 90 tablet 3    Sig: TAKE 1 TABLET BY MOUTH EVERY DAY     Cardiovascular:  Antilipid - Statins 2 Failed - 07/13/2022  1:19 AM      Failed - Lipid Panel in normal range within the last 12 months    Cholesterol, Total  Date Value Ref Range Status  10/25/2015 205 (H) 100 - 199 mg/dL Final   Cholesterol  Date Value Ref Range Status  05/22/2022 196 <200 mg/dL Final   LDL Cholesterol (Calc)  Date Value Ref Range Status  05/22/2022 91 mg/dL (calc) Final    Comment:    Reference range: <100 . Desirable range <100 mg/dL for primary prevention;   <70 mg/dL for patients with CHD or diabetic patients  with > or = 2 CHD risk factors. Marland Kitchen LDL-C is now calculated using the Martin-Hopkins  calculation, which is a validated novel method providing  better accuracy than the Friedewald equation in the  estimation of LDL-C.  Cresenciano Genre et al. Annamaria Helling. 8502;774(12): 2061-2068  (http://education.[facility].com/faq/FAQ164)    HDL  Date Value Ref Range Status  05/22/2022 82 > OR = 40 mg/dL Final  10/25/2015 74 >39 mg/dL Final   Triglycerides  Date Value Ref Range Status  05/22/2022 133 <150 mg/dL Final         Passed - Cr in normal range and within 360 days    Creat  Date Value Ref Range Status  05/22/2022 1.05 0.70 - 1.35 mg/dL Final         Passed - Patient is not pregnant      Passed - Valid encounter within last 12 months    Recent Outpatient Visits           1 month ago Well adult exam   [facility] Steele Sizer, MD   5 months ago Primary hypertension   [facility] Steele Sizer, MD   7 months ago Hospital discharge follow-up   [facility] Steele Sizer, MD   1 year ago Routine general medical examination at a health care facility   [facility] Myles Gip, DO   1 year ago History of [facility] Steele Sizer, MD

## 2022-08-01 ENCOUNTER — Ambulatory Visit: Payer: 59 | Admitting: Family Medicine

## 2022-08-03 ENCOUNTER — Other Ambulatory Visit: Payer: Self-pay

## 2022-08-03 DIAGNOSIS — N529 Male erectile dysfunction, unspecified: Secondary | ICD-10-CM

## 2022-08-03 DIAGNOSIS — I700 Atherosclerosis of aorta: Secondary | ICD-10-CM

## 2022-08-03 DIAGNOSIS — I5032 Chronic diastolic (congestive) heart failure: Secondary | ICD-10-CM

## 2022-08-03 DIAGNOSIS — K219 Gastro-esophageal reflux disease without esophagitis: Secondary | ICD-10-CM

## 2022-08-03 DIAGNOSIS — Z0000 Encounter for general adult medical examination without abnormal findings: Secondary | ICD-10-CM

## 2022-08-03 DIAGNOSIS — E7800 Pure hypercholesterolemia, unspecified: Secondary | ICD-10-CM

## 2022-08-03 DIAGNOSIS — I10 Essential (primary) hypertension: Secondary | ICD-10-CM

## 2022-08-03 MED ORDER — VITAMIN D (CHOLECALCIFEROL) 50 MCG (2000 UT) PO CAPS: 1.0000 | ORAL_CAPSULE | Freq: Every day | ORAL | 0 refills | Status: AC

## 2022-08-03 MED ORDER — ROSUVASTATIN CALCIUM 10 MG PO TABS: 10.0000 mg | ORAL_TABLET | Freq: Every day | ORAL | 0 refills | Status: AC

## 2022-08-03 MED ORDER — SILDENAFIL CITRATE 100 MG PO TABS: 50.0000 mg | ORAL_TABLET | Freq: Every day | ORAL | 0 refills | Status: AC | PRN

## 2022-08-03 MED ORDER — VALSARTAN 80 MG PO TABS: 80.0000 mg | ORAL_TABLET | Freq: Every day | ORAL | 0 refills | Status: AC

## 2022-08-03 MED ORDER — ASPIRIN 81 MG PO TBEC: 81.0000 mg | DELAYED_RELEASE_TABLET | Freq: Every day | ORAL | 1 refills | Status: AC

## 2022-08-03 MED ORDER — PANTOPRAZOLE SODIUM 40 MG PO TBEC: 40.0000 mg | DELAYED_RELEASE_TABLET | Freq: Every day | ORAL | 0 refills | Status: AC

## 2022-08-09 ENCOUNTER — Other Ambulatory Visit: Payer: Self-pay | Admitting: Family Medicine

## 2022-08-09 DIAGNOSIS — K219 Gastro-esophageal reflux disease without esophagitis: Secondary | ICD-10-CM

## 2022-08-29 ENCOUNTER — Other Ambulatory Visit: Payer: Self-pay | Admitting: Family Medicine

## 2022-08-29 DIAGNOSIS — I5032 Chronic diastolic (congestive) heart failure: Secondary | ICD-10-CM

## 2022-08-29 DIAGNOSIS — I10 Essential (primary) hypertension: Secondary | ICD-10-CM

## 2023-06-13 ENCOUNTER — Ambulatory Visit: Payer: 59

## 2023-08-12 IMAGING — CT CT CHEST LCS NODULE FOLLOW-UP W/O CM
2 of 5 series · 15 of 40 positions shown, 18 images · non-contrast
Comparison: 02/20/2021

CLINICAL DATA: 65-year-old male with 86 pack-year history of
smoking. Lung cancer screening.

EXAM:
CT CHEST WITHOUT CONTRAST FOR LUNG CANCER SCREENING NODULE FOLLOW-UP
TECHNIQUE: Multidetector CT imaging of the chest was performed following the
standard protocol without IV contrast.

[Series 3: lung lcs f/u 1.00 · axial · 0.66mm/px · z∈[-1239,-923]mm · 12 of 348 slices shown, 15 images]
[im 16/348  mediastinal]
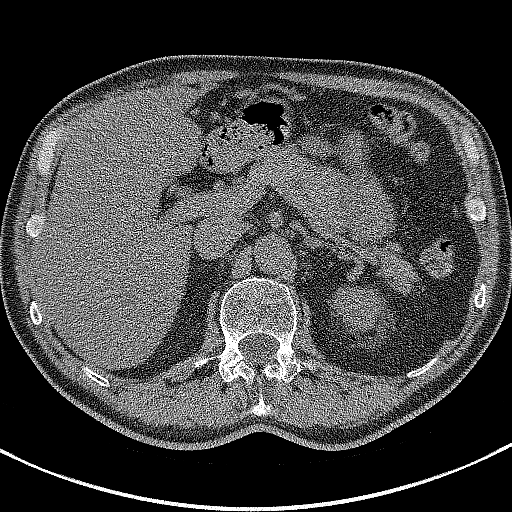
[im 16/348  lung]
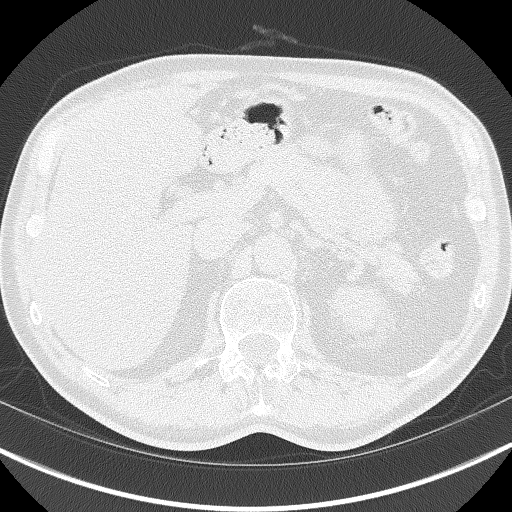
[im 48/348  lung]
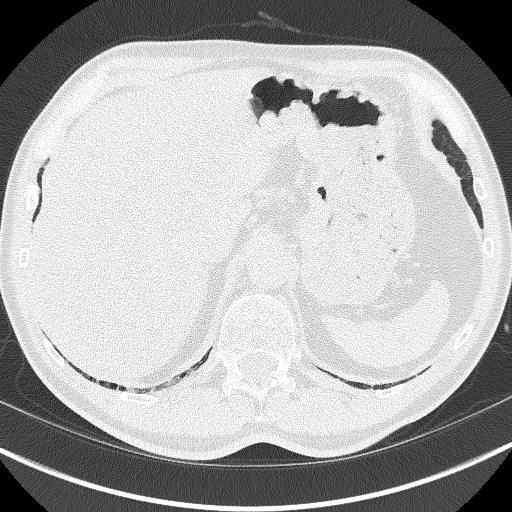
[im 79/348  lung]
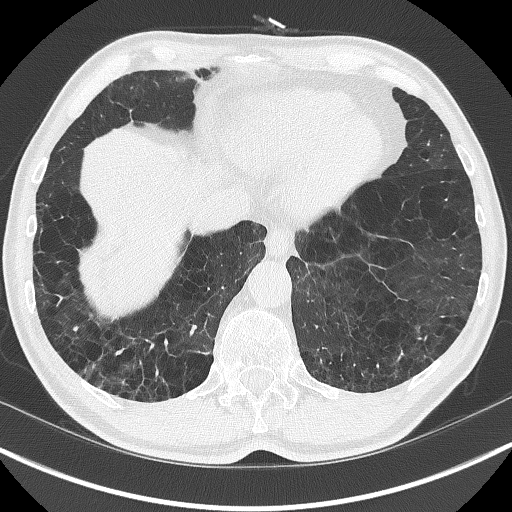
[im 111/348  lung]
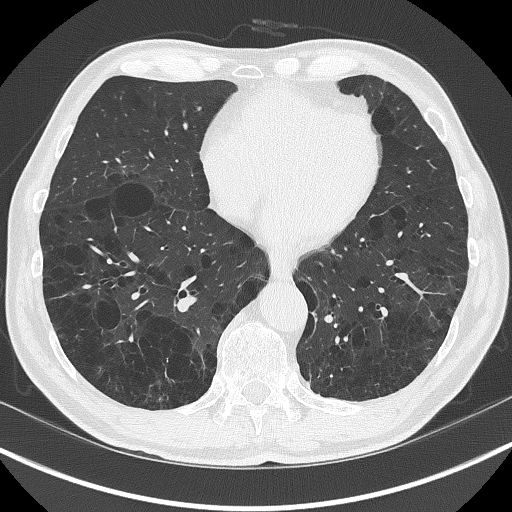
[im 127/348  mediastinal]
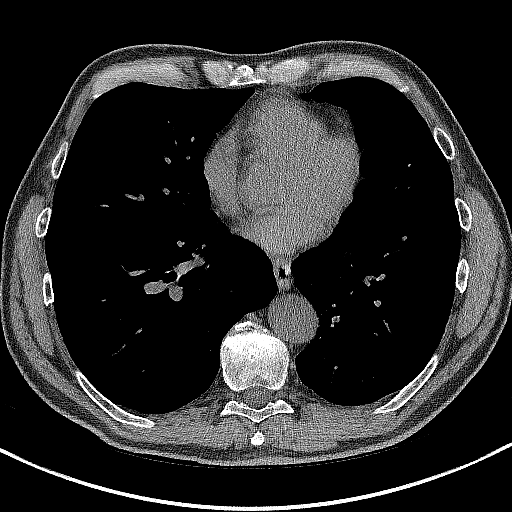
[im 127/348  lung]
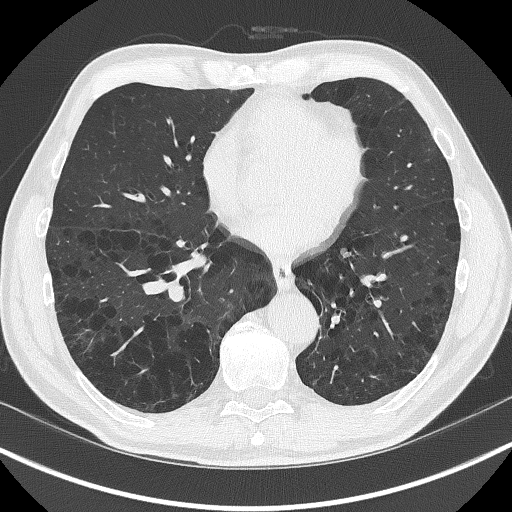
[im 158/348  lung]
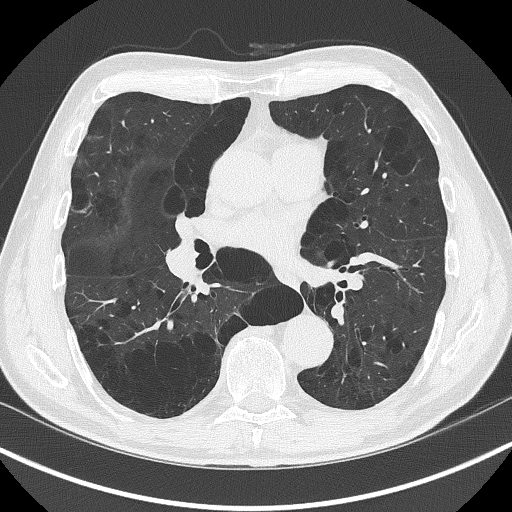
[im 190/348  lung]
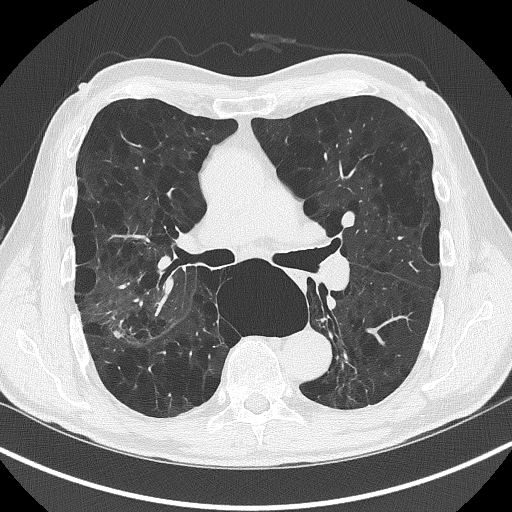
[im 221/348  lung]
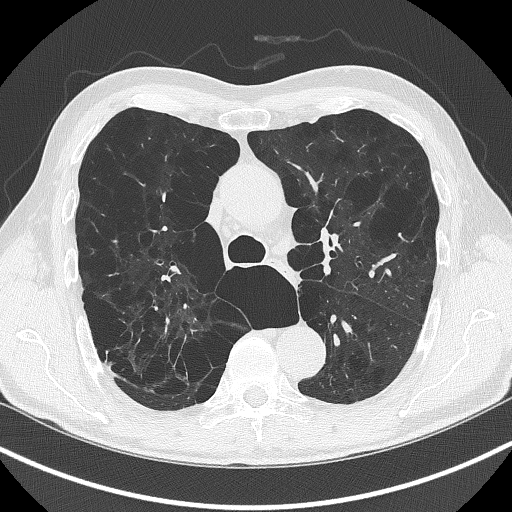
[im 237/348  mediastinal]
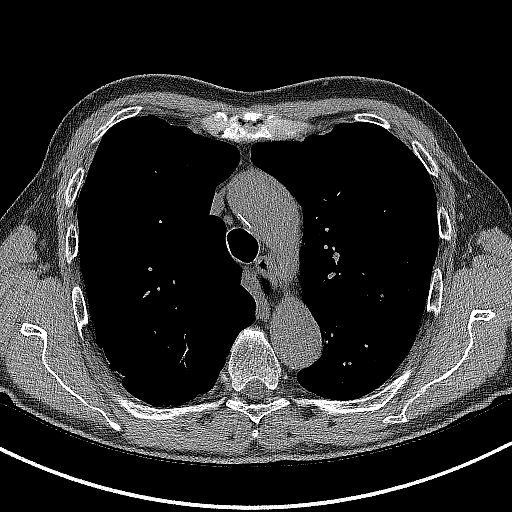
[im 237/348  lung]
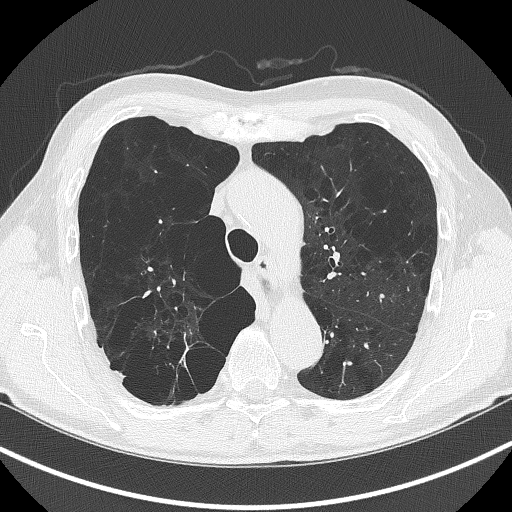
[im 269/348  lung]
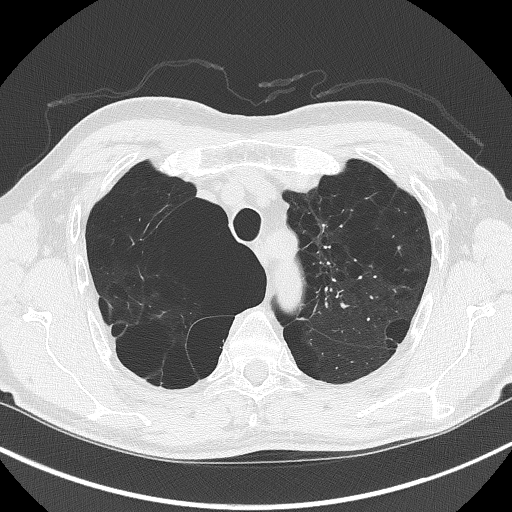
[im 300/348  lung]
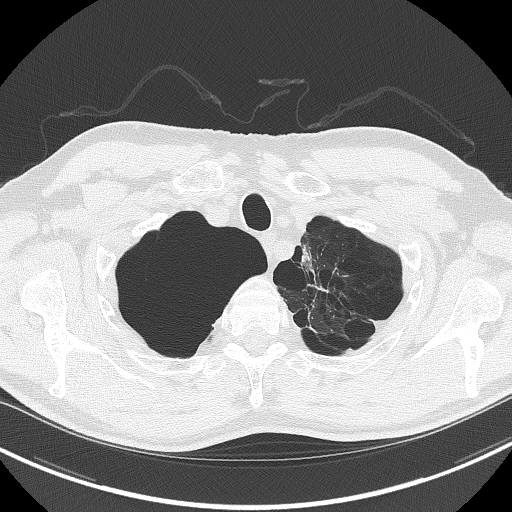
[im 332/348  lung]
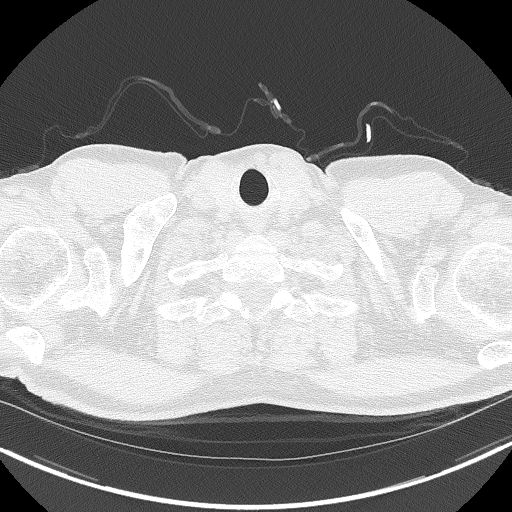

[Series 5: lcs f/u 1.00 cor · coronal · 0.66mm/px · 3 of 290 slices shown]
[im 58/290  lung]
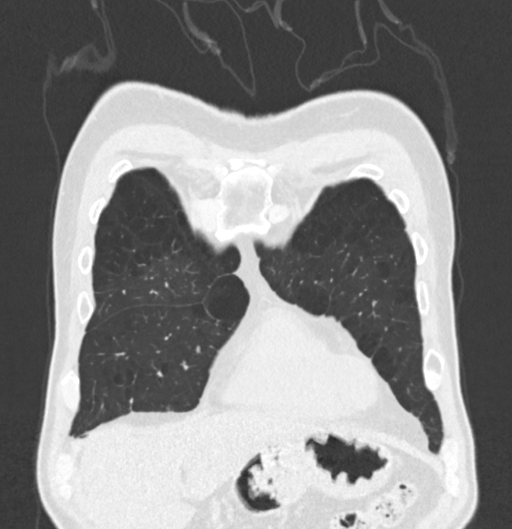
[im 116/290  lung]
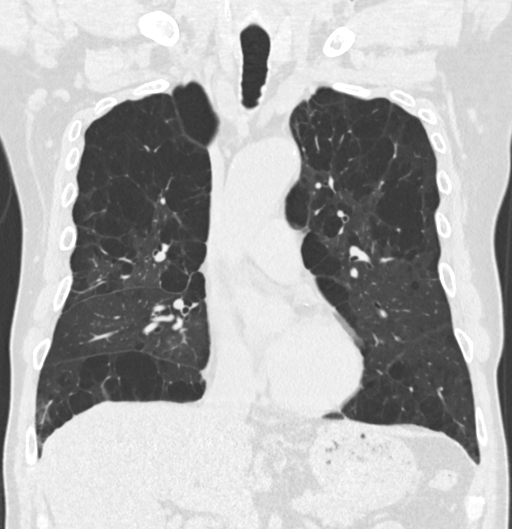
[im 174/290  lung]
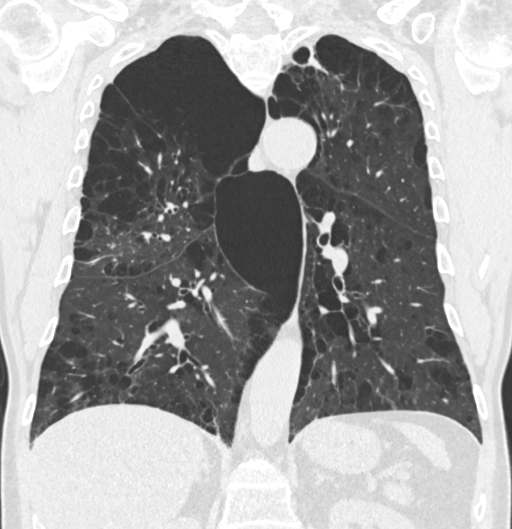

[15 of 40 positions shown; findings below may reference images not displayed]

FINDINGS: Cardiovascular: The heart size is normal. No substantial pericardial
effusion. Coronary artery calcification is evident. Mild
atherosclerotic calcification is noted in the wall of the thoracic
aorta. Ascending thoracic aorta measures 3.9 cm diameter.

Mediastinum/Nodes: No mediastinal lymphadenopathy. No evidence for
gross hilar lymphadenopathy although assessment is limited by the
lack of intravenous contrast on the current study. The esophagus has
normal imaging features. There is no axillary lymphadenopathy.

Lungs/Pleura: Advanced changes of emphysema noted bilaterally with
bullous changes in the upper lobes, right greater than left.
Scattered areas of architectural distortion and scarring are noted
in the parenchyma of both lungs. The bandlike opacity identified
previously in the medial left lung apex has decreased in the
interval although there is some persistent nodularity in this region
(image 55) likely reflecting resolving infectious/inflammatory
process.

Upper Abdomen: The liver shows diffusely decreased attenuation
suggesting fat deposition.

Musculoskeletal: No worrisome lytic or sclerotic osseous
abnormality. Subacute anterior right ninth rib fracture evident.
IMPRESSION: 1. Lung-RADS 2, benign appearance or behavior. Continue annual
screening with low-dose chest CT without contrast in 12 months.
mm bandlike area of opacity in the medial left lung apex on the
prior study has decreased substantially in the interval although not
yet resolved. Imaging features likely reflect
infectious/inflammatory etiology and/or residual scarring.
2. Subacute anterior right ninth rib fracture.
3. Hepatic steatosis.
4. Aortic Atherosclerosis (G3P03-GD8.8) and Emphysema (G3P03-K1E.9).

## 2024-02-10 IMAGING — CT CT CERVICAL SPINE W/O CM
3 of 4 series · 13 of 33 positions shown, 16 images · non-contrast
Comparison: None Available.

CLINICAL DATA: Neck trauma, intoxicated or obtunded (Age >= 16y).
Syncope.



[Series 6: sag bone · sagittal · 0.36mm/px · 5 of 65 slices shown, 6 images]
[im 22/65  bone]
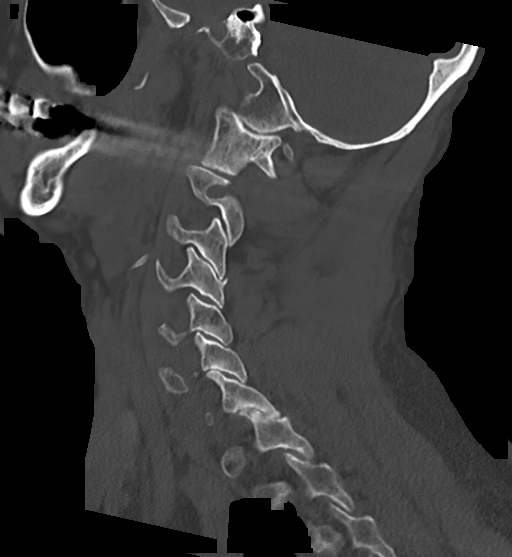
[im 27/65  bone]
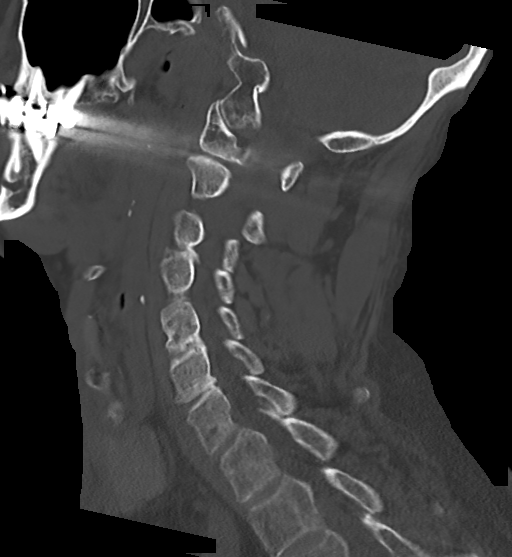
[im 33/65  soft-tissue]
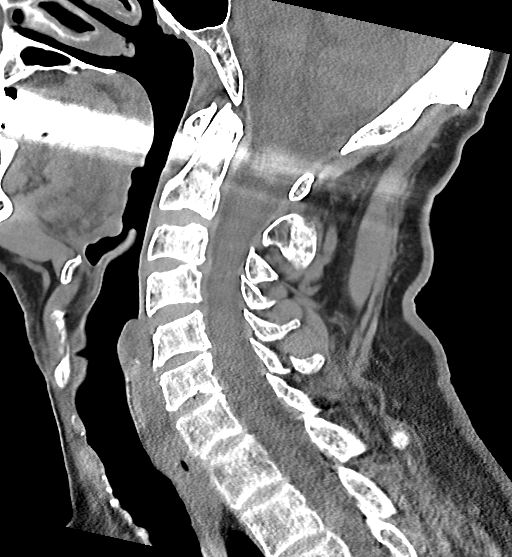
[im 33/65  bone]
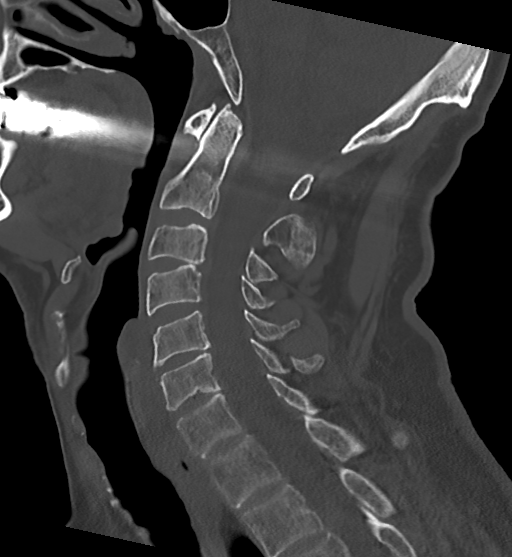
[im 38/65  bone]
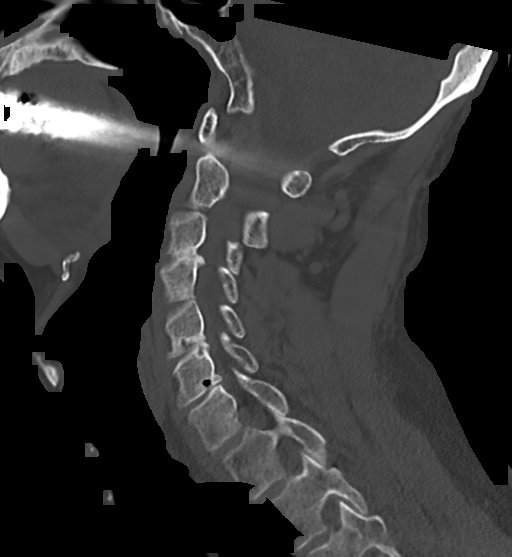
[im 43/65  bone]
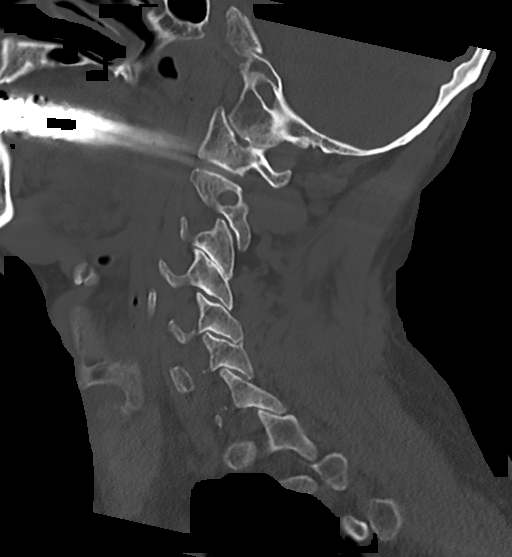

[Series 7: cor bone · coronal · 0.32mm/px · 3 of 69 slices shown]
[im 14/69  bone]
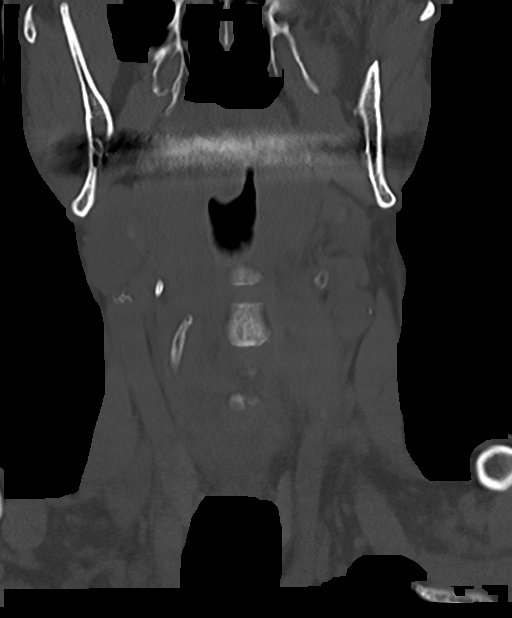
[im 28/69  bone]
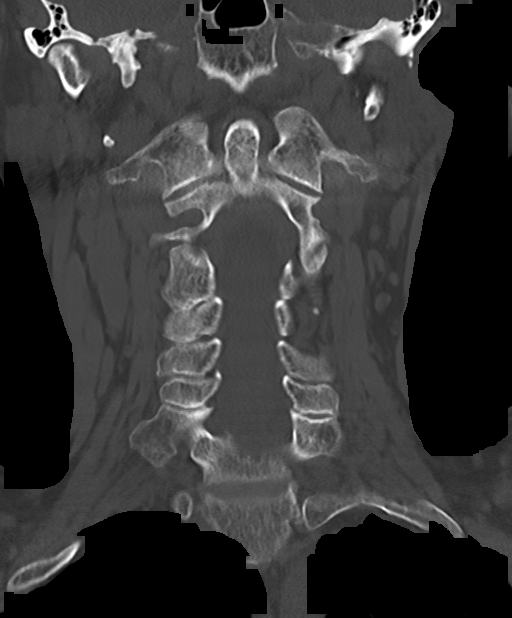
[im 41/69  bone]
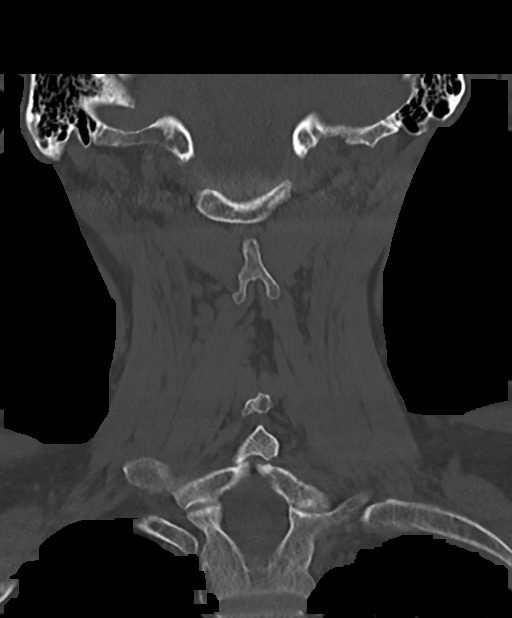

[Series 8: orthogonal axials · axial · 0.23mm/px · z∈[-259,-133]mm · 5 of 99 slices shown, 7 images]
[im 17/99  soft-tissue]
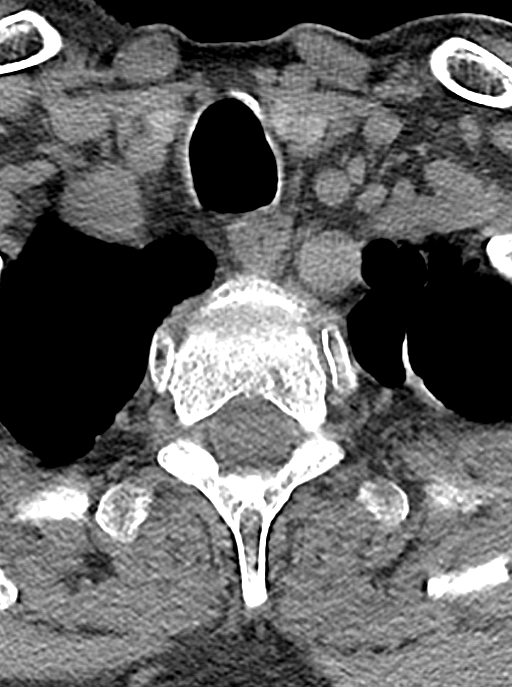
[im 17/99  bone]
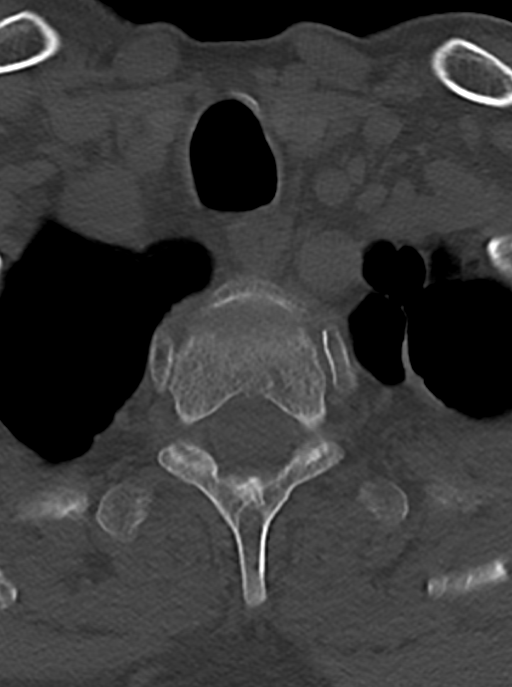
[im 33/99  bone]
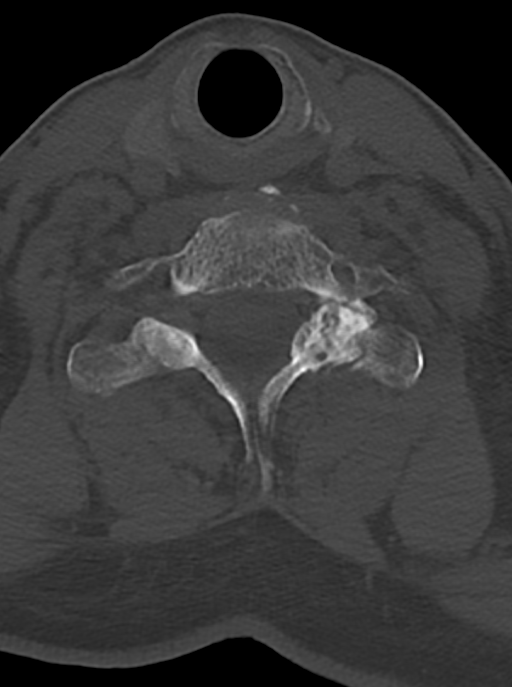
[im 50/99  bone]
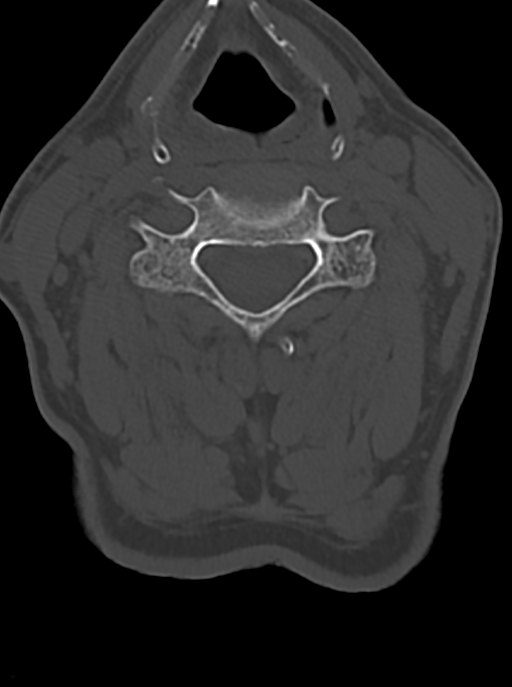
[im 66/99  bone]
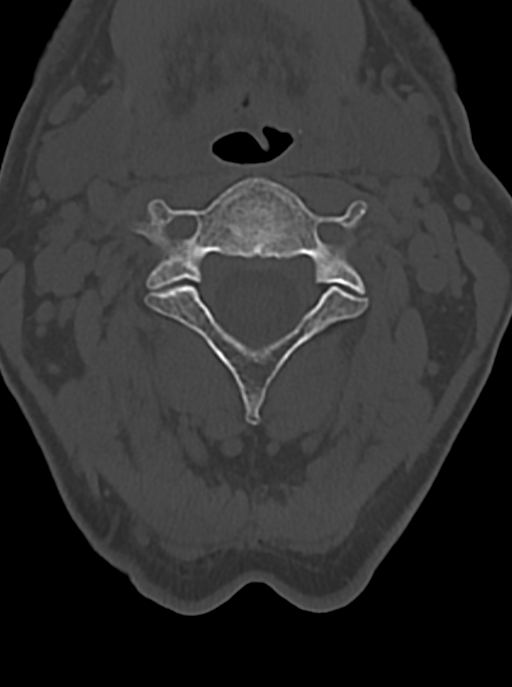
[im 82/99  soft-tissue]
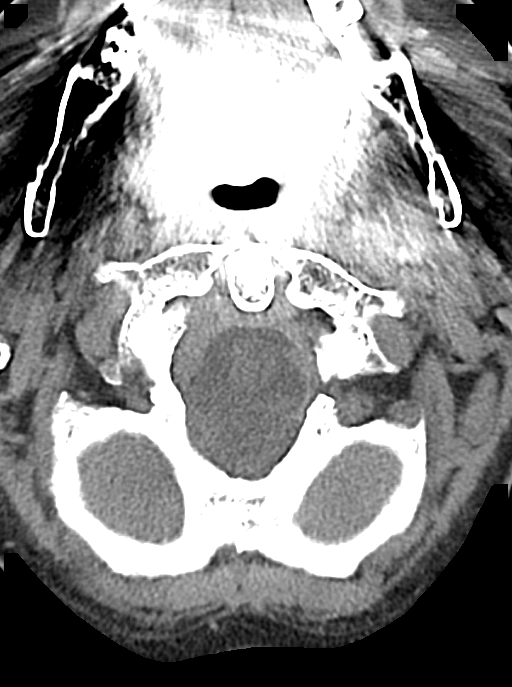
[im 82/99  bone]
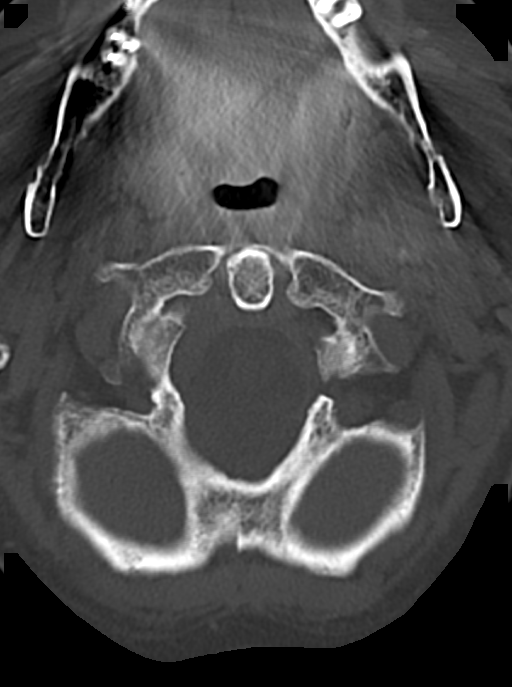

[13 of 33 positions shown; findings below may reference images not displayed]

FINDINGS: Alignment: Normal

Skull base and vertebrae: No acute fracture. No primary bone lesion
or focal pathologic process.

Soft tissues and spinal canal: No prevertebral fluid or swelling. No
visible canal hematoma.

Disc levels:  Disc spaces maintained.

Upper chest: Emphysema.

Other: None
IMPRESSION: No acute bony abnormality.
